# Patient Record
Sex: Male | Born: 1980 | Race: Black or African American | Hispanic: No | Marital: Single | Smoking: Current every day smoker
Health system: Southern US, Community
[De-identification: ages and names within clinical notes are randomized; demographics above are authoritative.]

---

## 2020-10-30 ENCOUNTER — Encounter (HOSPITAL_BASED_OUTPATIENT_CLINIC_OR_DEPARTMENT_OTHER): Payer: Self-pay

## 2020-10-30 ENCOUNTER — Emergency Department (HOSPITAL_BASED_OUTPATIENT_CLINIC_OR_DEPARTMENT_OTHER)
Admission: EM | Admit: 2020-10-30 | Discharge: 2020-10-30 | Disposition: A | Discharge status: Home / Self Care | Payer: 59 | Attending: Emergency Medicine | Admitting: Emergency Medicine

## 2020-10-30 ENCOUNTER — Other Ambulatory Visit: Payer: Self-pay

## 2020-10-30 DIAGNOSIS — F17210 Nicotine dependence, cigarettes, uncomplicated: Secondary | ICD-10-CM | POA: Insufficient documentation

## 2020-10-30 DIAGNOSIS — R059 Cough, unspecified: Secondary | ICD-10-CM | POA: Diagnosis present

## 2020-10-30 DIAGNOSIS — J069 Acute upper respiratory infection, unspecified: Secondary | ICD-10-CM | POA: Insufficient documentation

## 2020-10-30 MED ORDER — BENZONATATE 100 MG PO CAPS: 100.0000 mg | ORAL_CAPSULE | Freq: Three times a day (TID) | ORAL | 0 refills | Status: DC

## 2020-10-30 NOTE — ED Notes (Signed)
Attempted to obtain COVID swab, unable to tolerate, refused addition attempt

## 2020-10-30 NOTE — ED Provider Notes (Signed)
[facility] EMERGENCY DEPARTMENT Provider Note   CSN: [id number] Arrival date & time: 10/30/20  1131     History Chief Complaint  Patient presents with   Cough    Steven Herring is a 40 y.o. male.  Patient presents to the emergency department for evaluation of runny nose and cough.  Symptoms started about 4 days ago.  Today is day 4 of illness.  No reported fevers or chills, no sore throat.  No known sick contacts.  No vomiting or diarrhea.  Denies shortness of breath.  He has used some over-the-counter medication.      History reviewed. No pertinent past medical history.  There are no problems to display for this patient.   History reviewed. No pertinent surgical history.     No family history on file.  Social History   Tobacco Use   Smoking status: Every Day    Packs/day: 1.00    Types: Cigarettes   Smokeless tobacco: Never  Substance Use Topics   Alcohol use: Not Currently   Drug use: Not Currently    Home Medications Prior to Admission medications   Medication Sig Start Date End Date Taking? Authorizing Provider  benzonatate (TESSALON) 100 MG capsule Take 1 capsule (100 mg total) by mouth every 8 (eight) hours. 10/30/20  Yes Renne Crigler, PA-C    Allergies    Patient has no known allergies.  Review of Systems   Review of Systems  Constitutional:  Negative for chills, fatigue and fever.  HENT:  Positive for congestion and rhinorrhea. Negative for ear pain, sinus pressure and sore throat.   Eyes:  Negative for redness.  Respiratory:  Positive for cough. Negative for choking, shortness of breath and wheezing.   Gastrointestinal:  Negative for abdominal pain, diarrhea, nausea and vomiting.  Genitourinary:  Negative for dysuria.  Musculoskeletal:  Negative for myalgias and neck stiffness.  Skin:  Negative for rash.  Neurological:  Negative for headaches.  Hematological:  Negative for adenopathy.   Physical Exam Updated Vital Signs BP  117/81 (BP Location: Left Arm)   Pulse (!) 55   Temp 98.5 F (36.9 C) (Oral)   Resp 20   Ht 5\' 7"  (1.702 m)   Wt 97.5 kg   SpO2 98%   BMI 33.67 kg/m   Physical Exam Vitals and nursing note reviewed.  Constitutional:      General: He is not in acute distress.    Appearance: He is well-developed.  HENT:     Head: Normocephalic and atraumatic.     Right Ear: Tympanic membrane, ear canal and external ear normal.     Left Ear: Tympanic membrane, ear canal and external ear normal.     Nose: Congestion present.     Mouth/Throat:     Pharynx: Oropharynx is clear. No oropharyngeal exudate or posterior oropharyngeal erythema.  Eyes:     General:        Right eye: No discharge.        Left eye: No discharge.     Conjunctiva/sclera: Conjunctivae normal.  Cardiovascular:     Rate and Rhythm: Normal rate and regular rhythm.     Heart sounds: Normal heart sounds.  Pulmonary:     Effort: Pulmonary effort is normal.     Breath sounds: Normal breath sounds.     Comments: Lungs clear to auscultation bilaterally. Abdominal:     Palpations: Abdomen is soft.     Tenderness: There is no abdominal tenderness.  Musculoskeletal:  Cervical back: Normal range of motion and neck supple.  Skin:    General: Skin is warm and dry.  Neurological:     Mental Status: He is alert.    ED Results / Procedures / Treatments   Labs (all labs ordered are listed, but only abnormal results are displayed) Labs Reviewed - No data to display  EKG None  Radiology No results found.  Procedures Procedures   Medications Ordered in ED Medications - No data to display  ED Course  I have reviewed the triage vital signs and the nursing notes.  Pertinent labs & imaging results that were available during my care of the patient were reviewed by me and considered in my medical decision making (see chart for details).  Patient seen and examined.   Vital signs reviewed and are as follows: BP 117/81 (BP  Location: Left Arm)   Pulse (!) 55   Temp 98.5 F (36.9 C) (Oral)   Resp 20   Ht 5\' 7"  (1.702 m)   Wt 97.5 kg   SpO2 98%   BMI 33.67 kg/m   Offered additional times at COVID testing as he could not tolerate first attempt.  Patient declines.  Discussed that he should probably isolate himself through tomorrow assuming it is positive.  Then he can break isolation if he is feeling better.  Will discharge with prescription for Tessalon for symptom control.  Patient counseled on supportive care for viral URI and s/s to return including worsening symptoms, persistent fever, persistent vomiting, or if they have any other concerns. Urged to see PCP if symptoms persist for more than 3 days. Patient verbalizes understanding and agrees with plan.   Steven Herring was evaluated in Emergency Department on 10/30/2020 for the symptoms described in the history of present illness. He was evaluated in the context of the global COVID-19 pandemic, which necessitated consideration that the patient might be at risk for infection with the SARS-CoV-2 virus that causes COVID-19. Institutional protocols and algorithms that pertain to the evaluation of patients at risk for COVID-19 are in a state of rapid change based on information released by regulatory bodies including the CDC and federal and state organizations. These policies and algorithms were followed during the patient's care in the ED.     MDM Rules/Calculators/A&P                           Patient with cough, other upper respiratory symptoms.  Overall symptoms are minor.  No respiratory distress or trouble breathing.  COVID testing attempted, patient cannot tolerate.  Will have him isolate for total 5 days and continue symptomatic treatments.  He looks well.    Final Clinical Impression(s) / ED Diagnoses Final diagnoses:  Viral URI with cough    Rx / DC Orders ED Discharge Orders          Ordered    benzonatate (TESSALON) 100 MG capsule  Every  8 hours        10/30/20 1344             11/01/20, PA-C 10/30/20 1348    11/01/20, MD 10/30/20 [phone]

## 2020-10-30 NOTE — Discharge Instructions (Signed)
Please read and follow all provided instructions.  Your diagnoses today include:  1. Viral URI with cough     You appear to have an upper respiratory infection (URI). An upper respiratory tract infection, or cold, is a viral infection of the air passages leading to the lungs. It should improve gradually after 5-7 days. You may have a lingering cough that lasts for 2- 4 weeks after the infection.  Tests performed today include: Vital signs. See below for your results today.   Medications prescribed:  Tessalon Perles - cough suppressant medication  Take any prescribed medications only as directed. Treatment for your infection is aimed at treating the symptoms. There are no medications, such as antibiotics, that will cure your infection.   Home care instructions:  Follow any educational materials contained in this packet.   Your illness is contagious and can be spread to others, especially during the first 3 or 4 days. It cannot be cured by antibiotics or other medicines. Take basic precautions such as washing your hands often, covering your mouth when you cough or sneeze, and avoiding public places where you could spread your illness to others.   Please continue drinking plenty of fluids.  Use over-the-counter medicines as needed as directed on packaging for symptom relief.  You may also use ibuprofen or tylenol as directed on packaging for pain or fever.  Do not take multiple medicines containing Tylenol or acetaminophen to avoid taking too much of this medication.  Follow-up instructions: Please follow-up with your primary care provider in the next 3 days for further evaluation of your symptoms if you are not feeling better.   Return instructions:  Please return to the Emergency Department if you experience worsening symptoms.  RETURN IMMEDIATELY IF you develop shortness of breath, confusion or altered mental status, a new rash, become dizzy, faint, or poorly responsive, or are unable to  be cared for at home. Please return if you have persistent vomiting and cannot keep down fluids or develop a fever that is not controlled by tylenol or motrin.   Please return if you have any other emergent concerns.  Additional Information:  Your vital signs today were: BP 117/81 (BP Location: Left Arm)   Pulse (!) 55   Temp 98.5 F (36.9 C) (Oral)   Resp 20   Ht 5\' 7"  (1.702 m)   Wt 97.5 kg   SpO2 98%   BMI 33.67 kg/m  If your blood pressure (BP) was elevated above 135/85 this visit, please have this repeated by your doctor within one month. --------------

## 2020-10-30 NOTE — ED Triage Notes (Signed)
Pt reports runny nose and dry cough for a week. Denies being around anyone sick with covid but "wants to be checked for work". Denies fever, N/V or sore throat. NAD in triage, HR 62 at this time

## 2020-11-01 ENCOUNTER — Other Ambulatory Visit: Payer: Self-pay

## 2020-11-01 ENCOUNTER — Emergency Department (HOSPITAL_BASED_OUTPATIENT_CLINIC_OR_DEPARTMENT_OTHER)
Admission: EM | Admit: 2020-11-01 | Discharge: 2020-11-01 | Disposition: A | Discharge status: Home / Self Care | Payer: 59 | Attending: Emergency Medicine | Admitting: Emergency Medicine

## 2020-11-01 ENCOUNTER — Encounter (HOSPITAL_BASED_OUTPATIENT_CLINIC_OR_DEPARTMENT_OTHER): Payer: Self-pay | Admitting: *Deleted

## 2020-11-01 ENCOUNTER — Emergency Department (HOSPITAL_BASED_OUTPATIENT_CLINIC_OR_DEPARTMENT_OTHER): Payer: 59

## 2020-11-01 DIAGNOSIS — F17210 Nicotine dependence, cigarettes, uncomplicated: Secondary | ICD-10-CM | POA: Diagnosis not present

## 2020-11-01 DIAGNOSIS — S51811A Laceration without foreign body of right forearm, initial encounter: Secondary | ICD-10-CM

## 2020-11-01 DIAGNOSIS — W25XXXA Contact with sharp glass, initial encounter: Secondary | ICD-10-CM | POA: Diagnosis not present

## 2020-11-01 DIAGNOSIS — S59911A Unspecified injury of right forearm, initial encounter: Secondary | ICD-10-CM | POA: Diagnosis present

## 2020-11-01 MED ORDER — LIDOCAINE-EPINEPHRINE (PF) 2 %-1:200000 IJ SOLN
10.0000 mL | Freq: Once | INTRAMUSCULAR | Status: AC
  Administered 2020-11-01: 10 mL
  Filled 2020-11-01: qty 20

## 2020-11-01 NOTE — ED Provider Notes (Signed)
[facility] EMERGENCY DEPARTMENT Provider Note   CSN: [id number] Arrival date & time: 11/01/20  1828     History Chief Complaint  Patient presents with   Laceration    Steven Herring is a 40 y.o. male to the ED today after sustaining a laceration to his right forearm.  He states a window pain from a glass fell on his arm cutting him today.  He is up-to-date on tetanus.  He complains of mild pain to the area.  Bleeding  somewhat actively on exam today.  He is not anticoagulated.  No other complaints at this time.  The history is provided by the patient and medical records.      History reviewed. No pertinent past medical history.  There are no problems to display for this patient.   History reviewed. No pertinent surgical history.     No family history on file.  Social History   Tobacco Use   Smoking status: Every Day    Packs/day: 1.00    Types: Cigarettes   Smokeless tobacco: Never  Substance Use Topics   Alcohol use: Not Currently   Drug use: Not Currently    Home Medications Prior to Admission medications   Medication Sig Start Date End Date Taking? Authorizing Provider  benzonatate (TESSALON) 100 MG capsule Take 1 capsule (100 mg total) by mouth every 8 (eight) hours. 10/30/20   Renne Crigler, PA-C    Allergies    Patient has no known allergies.  Review of Systems   Review of Systems  Constitutional:  Negative for fever.  Musculoskeletal:  Positive for arthralgias.  Skin:  Positive for wound.  All other systems reviewed and are negative.  Physical Exam Updated Vital Signs BP 118/64   Pulse 69   Temp 98.6 F (37 C) (Oral)   Resp 16   Ht 5\' 7"  (1.702 m)   Wt 97.5 kg   SpO2 98%   BMI 33.67 kg/m   Physical Exam Vitals and nursing note reviewed.  Constitutional:      Appearance: He is not ill-appearing.  HENT:     Head: Normocephalic and atraumatic.  Eyes:     Conjunctiva/sclera: Conjunctivae normal.  Cardiovascular:     Rate  and Rhythm: Normal rate and regular rhythm.  Pulmonary:     Effort: Pulmonary effort is normal.     Breath sounds: Normal breath sounds.  Musculoskeletal:     Comments: 1.5 cm laceration to dorsal aspect of right mid forearm with mild amount of active bleeding on exam, controlled with pressure. ROM intact to wrist, fingers, and elbow. 2+ radial pulse.   Skin:    General: Skin is warm and dry.     Coloration: Skin is not jaundiced.  Neurological:     Mental Status: He is alert.    ED Results / Procedures / Treatments   Labs (all labs ordered are listed, but only abnormal results are displayed) Labs Reviewed - No data to display  EKG None  Radiology DG Forearm Right  Result Date: 11/01/2020 CLINICAL DATA:  Right forearm laceration from glass. Rule out foreign body. EXAM: RIGHT FOREARM - 2 VIEW COMPARISON:  None. FINDINGS: Normal alignment. No fracture or dislocation. Surgical dressing applied to the radial soft tissues at the level of the distal diaphysis of the radius. No retained radiopaque foreign body identified. IMPRESSION: No retained radiopaque foreign body identified. Electronically Signed   By: 01/01/2021 MD   On: 11/01/2020 20:27    Procedures .10/01/2022Laceration  Repair  Date/Time: 11/01/2020 8:54 PM Performed by: Tanda Rockers, PA-C Authorized by: Tanda Rockers, PA-C   Consent:    Consent obtained:  Verbal   Consent given by:  Patient   Risks discussed:  Infection, pain and poor cosmetic result Universal protocol:    Patient identity confirmed:  Verbally with patient Laceration details:    Location:  Shoulder/arm   Shoulder/arm location:  R lower arm   Length (cm):  1.5   Depth (mm):  2 Pre-procedure details:    Preparation:  Patient was prepped and draped in usual sterile fashion Treatment:    Area cleansed with:  Povidone-iodine   Irrigation solution:  Sterile saline Skin repair:    Repair method:  Sutures   Suture size:  4-0   Suture material:  Nylon    Suture technique:  Simple interrupted   Number of sutures:  2 Approximation:    Approximation:  Close Repair type:    Repair type:  Intermediate Post-procedure details:    Dressing:  Non-adherent dressing   Procedure completion:  Tolerated well, no immediate complications   Medications Ordered in ED Medications  lidocaine-EPINEPHrine (XYLOCAINE W/EPI) 2 %-1:200000 (PF) injection 10 mL (has no administration in time range)    ED Course  I have reviewed the triage vital signs and the nursing notes.  Pertinent labs & imaging results that were available during my care of the patient were reviewed by me and considered in my medical decision making (see chart for details).    MDM Rules/Calculators/A&P                           40 year old male who presents to the ED after sustaining a laceration to his right forearm.  X-ray negative for any foreign body.  This sutures placed.  Patient instructed to return to the ED in 1 week for suture removal.  Instructed to return sooner for signs of infection.  Stable for discharge.  Final Clinical Impression(s) / ED Diagnoses Final diagnoses:  Laceration of right forearm, initial encounter    Rx / DC Orders ED Discharge Orders     None        Discharge Instructions      Please return to the ED in 1 week for suture removal Keep wound clean and dry.  If you begin having some pain from the glass falling onto your arm you can take Ibuprofen/Tylenol for pain, ice it, and elevate it.  Follow up with your PCP regarding ED visit today  Return sooner for any signs of infection including redness/swelling around the wound, drainage of pus, fevers > 100.4       Tanda Rockers, PA-C 11/01/20 2058    Vanetta Mulders, MD 11/04/20 1259

## 2020-11-01 NOTE — Discharge Instructions (Addendum)
Please return to the ED in 1 week for suture removal Keep wound clean and dry.  If you begin having some pain from the glass falling onto your arm you can take Ibuprofen/Tylenol for pain, ice it, and elevate it.  Follow up with your PCP regarding ED visit today  Return sooner for any signs of infection including redness/swelling around the wound, drainage of pus, fevers > 100.4

## 2020-11-01 NOTE — ED Triage Notes (Addendum)
C/o lac to right arm by dirty glass window x 30 mins ago , large bleeding  lac to right forearm , pressure drg applied

## 2020-11-12 ENCOUNTER — Emergency Department (HOSPITAL_BASED_OUTPATIENT_CLINIC_OR_DEPARTMENT_OTHER)
Admission: EM | Admit: 2020-11-12 | Discharge: 2020-11-12 | Disposition: A | Discharge status: Home / Self Care | Payer: Self-pay | Attending: Emergency Medicine | Admitting: Emergency Medicine

## 2020-11-12 ENCOUNTER — Encounter (HOSPITAL_BASED_OUTPATIENT_CLINIC_OR_DEPARTMENT_OTHER): Payer: Self-pay | Admitting: Urology

## 2020-11-12 ENCOUNTER — Other Ambulatory Visit: Payer: Self-pay

## 2020-11-12 DIAGNOSIS — Z4802 Encounter for removal of sutures: Secondary | ICD-10-CM | POA: Insufficient documentation

## 2020-11-12 DIAGNOSIS — W25XXXD Contact with sharp glass, subsequent encounter: Secondary | ICD-10-CM | POA: Insufficient documentation

## 2020-11-12 DIAGNOSIS — F17210 Nicotine dependence, cigarettes, uncomplicated: Secondary | ICD-10-CM | POA: Insufficient documentation

## 2020-11-12 DIAGNOSIS — S51811D Laceration without foreign body of right forearm, subsequent encounter: Secondary | ICD-10-CM | POA: Insufficient documentation

## 2020-11-12 NOTE — ED Notes (Signed)
ED Provider at bedside. 

## 2020-11-12 NOTE — ED Provider Notes (Addendum)
[facility] EMERGENCY DEPARTMENT Provider Note   CSN: [id number] Arrival date & time: 11/12/20  1916     History Chief Complaint  Patient presents with   Suture / Staple Removal    Steven Herring is a 40 y.o. male with no reported medical history.  Patient presents with a chief complaint of suture removal.  Patient sustained laceration to right forearm on 8/1 when a piece of glass accidentally cut him.  Patient was seen in the emergency department and had 2 sutures placed.  Patient denies fevers, chills, numbness, weakness, myalgia, rash, color change, purulent discharge.  Patient is right-hand dominant.   Suture / Staple Removal      History reviewed. No pertinent past medical history.  There are no problems to display for this patient.   History reviewed. No pertinent surgical history.     History reviewed. No pertinent family history.  Social History   Tobacco Use   Smoking status: Every Day    Packs/day: 1.00    Types: Cigarettes   Smokeless tobacco: Never  Substance Use Topics   Alcohol use: Not Currently   Drug use: Not Currently    Home Medications Prior to Admission medications   Medication Sig Start Date End Date Taking? Authorizing Provider  benzonatate (TESSALON) 100 MG capsule Take 1 capsule (100 mg total) by mouth every 8 (eight) hours. 10/30/20   Renne Crigler, PA-C    Allergies    Patient has no known allergies.  Review of Systems   Review of Systems  Constitutional:  Negative for chills and fever.  Gastrointestinal:  Negative for nausea and vomiting.  Musculoskeletal:  Negative for myalgias.  Skin:  Positive for wound. Negative for color change, pallor and rash.  Neurological:  Negative for weakness and numbness.  Psychiatric/Behavioral:  Negative for confusion.    Physical Exam Updated Vital Signs BP 109/74 (BP Location: Left Arm)   Pulse 81   Temp 98.3 F (36.8 C) (Oral)   Resp 18   Ht 5\' 7"  (1.702 m)   Wt 97.5 kg    SpO2 94%   BMI 33.67 kg/m   Physical Exam Vitals and nursing note reviewed.  Constitutional:      General: He is not in acute distress.    Appearance: He is not ill-appearing, toxic-appearing or diaphoretic.  HENT:     Head: Normocephalic.  Eyes:     General: No scleral icterus.       Right eye: No discharge.        Left eye: No discharge.  Cardiovascular:     Rate and Rhythm: Normal rate.  Pulmonary:     Effort: Pulmonary effort is normal.  Musculoskeletal:     Comments: Well-healed wound to posterior and active right forearm with 2 sutures present.  Wound is clean, dry, and intact.  No swelling, erythema, rash, or purulent discharge noted.  No tenderness to palpation.  Skin:    General: Skin is warm and dry.  Neurological:     General: No focal deficit present.     Mental Status: He is alert.     GCS: GCS eye subscore is 4. GCS verbal subscore is 5. GCS motor subscore is 6.  Psychiatric:        Behavior: Behavior is cooperative.    ED Results / Procedures / Treatments   Labs (all labs ordered are listed, but only abnormal results are displayed) Labs Reviewed - No data to display  EKG None  Radiology No results  found.  Procedures .Suture Removal  Date/Time: 11/13/2020 2:16 AM Performed by: Haskel Schroeder, PA-C Authorized by: Haskel Schroeder, PA-C   Consent:    Consent obtained:  Verbal   Consent given by:  Patient   Risks discussed:  Bleeding, pain and wound separation   Alternatives discussed:  No treatment Universal protocol:    Procedure explained and questions answered to patient or proxy's satisfaction: yes     Patient identity confirmed:  Verbally with patient Location:    Location:  Upper extremity   Upper extremity location:  Arm   Arm location:  R lower arm Procedure details:    Wound appearance:  No signs of infection, good wound healing and clean   Number of sutures removed:  2 Post-procedure details:    Post-removal:  No dressing  applied   Procedure completion:  Tolerated well, no immediate complications   Medications Ordered in ED Medications - No data to display  ED Course  I have reviewed the triage vital signs and the nursing notes.  Pertinent labs & imaging results that were available during my care of the patient were reviewed by me and considered in my medical decision making (see chart for details).    MDM Rules/Calculators/A&P                           Alert 40 year old male in no acute distress, nontoxic appearing.  Presents with chief complaint of suture removal.  Per chart review to sutures were placed on 8/1.    Well-healed wound to posterior and active right forearm with 2 sutures present.  Wound is clean, dry, and intact.  No swelling, erythema, rash, or purulent discharge noted.  No tenderness to palpation.   No signs of infection.  Suture removal procedure as noted above.  Discussed results, findings, treatment and follow up. Patient advised of return precautions. Patient verbalized understanding and agreed with plan.    Final Clinical Impression(s) / ED Diagnoses Final diagnoses:  Visit for suture removal    Rx / DC Orders ED Discharge Orders     None        Haskel Schroeder, PA-C 11/13/20 0219    Haskel Schroeder, PA-C 11/22/20 3536    Arby Barrette, MD 12/05/20 1427

## 2020-11-12 NOTE — Discharge Instructions (Addendum)
Your stitches were removed today without an problems.    Get help right away if: You have a fever. You have redness that is spreading from your wound.

## 2020-11-12 NOTE — ED Triage Notes (Signed)
Here for suture removal from right FA,

## 2020-12-21 ENCOUNTER — Other Ambulatory Visit: Payer: Self-pay

## 2020-12-21 ENCOUNTER — Encounter (HOSPITAL_BASED_OUTPATIENT_CLINIC_OR_DEPARTMENT_OTHER): Payer: Self-pay

## 2020-12-21 DIAGNOSIS — S99922A Unspecified injury of left foot, initial encounter: Secondary | ICD-10-CM | POA: Diagnosis present

## 2020-12-21 DIAGNOSIS — S9032XA Contusion of left foot, initial encounter: Secondary | ICD-10-CM | POA: Insufficient documentation

## 2020-12-21 DIAGNOSIS — Y990 Civilian activity done for income or pay: Secondary | ICD-10-CM | POA: Diagnosis not present

## 2020-12-21 DIAGNOSIS — M2012 Hallux valgus (acquired), left foot: Secondary | ICD-10-CM | POA: Insufficient documentation

## 2020-12-21 DIAGNOSIS — F17210 Nicotine dependence, cigarettes, uncomplicated: Secondary | ICD-10-CM | POA: Insufficient documentation
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
## 2020-12-22 ENCOUNTER — Emergency Department (HOSPITAL_BASED_OUTPATIENT_CLINIC_OR_DEPARTMENT_OTHER)
Admission: EM | Admit: 2020-12-22 | Discharge: 2020-12-22 | Disposition: A | Discharge status: Home / Self Care | Payer: No Typology Code available for payment source | Attending: Emergency Medicine | Admitting: Emergency Medicine

## 2020-12-22 ENCOUNTER — Emergency Department (HOSPITAL_BASED_OUTPATIENT_CLINIC_OR_DEPARTMENT_OTHER): Payer: Self-pay | Attending: Emergency Medicine

## 2020-12-22 DIAGNOSIS — S9032XA Contusion of left foot, initial encounter: Secondary | ICD-10-CM

## 2020-12-22 MED ORDER — DICLOFENAC SODIUM 1 % EX GEL: 2.0000 g | Freq: Four times a day (QID) | CUTANEOUS | 0 refills | Status: DC | PRN
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
## 2020-12-22 NOTE — Discharge Instructions (Signed)
You were seen in the emerge department with foot pain after injury.  The x-ray did not show any broken bones.  Please keep the foot elevated and you may use crutches as needed for comfort.  I am taking out of work for the next week and will have you follow closely with a sports medicine doctor listed.  Please call to make the appointment.  He develop any pain into the calf or leg swelling this could be sign of a blood clot but have very low suspicion for this at this point.

## 2020-12-29 ENCOUNTER — Encounter: Payer: Self-pay | Admitting: Family Medicine

## 2020-12-29 ENCOUNTER — Ambulatory Visit (INDEPENDENT_AMBULATORY_CARE_PROVIDER_SITE_OTHER): Payer: No Typology Code available for payment source | Admitting: Family Medicine

## 2020-12-29 ENCOUNTER — Ambulatory Visit: Payer: Self-pay

## 2020-12-29 VITALS — Ht 68.0 in | Wt 215.0 lb

## 2020-12-29 DIAGNOSIS — T148XXA Other injury of unspecified body region, initial encounter: Secondary | ICD-10-CM | POA: Diagnosis not present

## 2020-12-29 DIAGNOSIS — M79672 Pain in left foot: Secondary | ICD-10-CM

## 2020-12-29 NOTE — Patient Instructions (Signed)
Nice to meet you Please try to wean out of the boot  Please try ice   Please send me a message in MyChart with any questions or updates.  Please see me back in 2 weeks.   --Dr. Jordan Likes
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
## 2020-12-29 NOTE — Assessment & Plan Note (Signed)
Initial injury on 9/21.  Occurred at work.  No acute changes appreciated on ultrasound today on x-ray. -Counseled on home exercise therapy and supportive care. -Counseled on CAM Walker. -Provided work note. -Could consider further imaging.

## 2020-12-31 ENCOUNTER — Ambulatory Visit: Payer: Self-pay | Admitting: Family Medicine

## 2021-01-03 ENCOUNTER — Encounter: Payer: Self-pay | Admitting: Orthopedic Surgery

## 2021-01-03 ENCOUNTER — Ambulatory Visit (INDEPENDENT_AMBULATORY_CARE_PROVIDER_SITE_OTHER): Payer: No Typology Code available for payment source | Admitting: Orthopedic Surgery

## 2021-01-03 ENCOUNTER — Other Ambulatory Visit: Payer: Self-pay

## 2021-01-03 VITALS — Ht 68.0 in | Wt 215.0 lb

## 2021-01-03 DIAGNOSIS — S93622A Sprain of tarsometatarsal ligament of left foot, initial encounter: Secondary | ICD-10-CM | POA: Diagnosis not present
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
## 2021-01-04 ENCOUNTER — Other Ambulatory Visit: Payer: Self-pay

## 2021-01-04 DIAGNOSIS — S93622A Sprain of tarsometatarsal ligament of left foot, initial encounter: Secondary | ICD-10-CM

## 2021-01-14 ENCOUNTER — Ambulatory Visit: Payer: Self-pay | Admitting: Family Medicine

## 2021-01-14 NOTE — Progress Notes (Deleted)
  Steven Herring - 40 y.o. male MRN [id number]  Date of birth: 07-14-1980  SUBJECTIVE:  Including CC & ROS.  No chief complaint on file.   Steven Herring is a 40 y.o. male that is  ***.  ***   Review of Systems See HPI   HISTORY: Past Medical, Surgical, Social, and Family History Reviewed & Updated per EMR.   Pertinent Historical Findings include:  No past medical history on file.  No past surgical history on file.  No family history on file.  Social History   Socioeconomic History  . Marital status: Single    Spouse name: Not on file  . Number of children: Not on file  . Years of education: Not on file  . Highest education level: Not on file  Occupational History  . Not on file  Tobacco Use  . Smoking status: Every Day    Packs/day: 1.00    Types: Cigarettes  . Smokeless tobacco: Never  Substance and Sexual Activity  . Alcohol use: Not Currently  . Drug use: Not Currently  . Sexual activity: Not on file  Other Topics Concern  . Not on file  Social History Narrative  . Not on file   Social Determinants of Health   Financial Resource Strain: Not on file  Food Insecurity: Not on file  Transportation Needs: Not on file  Physical Activity: Not on file  Stress: Not on file  Social Connections: Not on file  Intimate Partner Violence: Not on file     PHYSICAL EXAM:  VS: There were no vitals taken for this visit. Physical Exam Gen: NAD, alert, cooperative with exam, well-appearing MSK:  ***      ASSESSMENT & PLAN:   No problem-specific Assessment & Plan notes found for this encounter.

## 2021-01-20 ENCOUNTER — Encounter: Payer: Self-pay | Admitting: Orthopedic Surgery

## 2021-01-20 ENCOUNTER — Ambulatory Visit (INDEPENDENT_AMBULATORY_CARE_PROVIDER_SITE_OTHER): Payer: No Typology Code available for payment source | Admitting: Orthopedic Surgery

## 2021-01-20 ENCOUNTER — Other Ambulatory Visit: Payer: Self-pay

## 2021-01-20 DIAGNOSIS — S93622A Sprain of tarsometatarsal ligament of left foot, initial encounter: Secondary | ICD-10-CM | POA: Diagnosis not present

## 2021-01-20 NOTE — Progress Notes (Signed)
   Office Visit Note   Patient: Steven Herring           Date of Birth: 1980/10/07           MRN: [id number] Visit Date: 01/20/2021              Requested by: No referring provider defined for this encounter. PCP: Pcp, No  Chief Complaint  Patient presents with   Left Foot - Follow-up    MRI review  [facility] DOI 12/20/20      HPI: Patient is a 40 year old gentleman who presents in follow-up for Lisfranc sprain of the left foot.  Patient states he has had no change in his symptoms still has pain across the midfoot.  Patient states he is still smoking.  Patient is on crutches and a fracture boot.  Assessment & Plan: Visit Diagnoses:  1. Lisfranc's sprain, left, initial encounter     Plan: Recommended proceeding with 4 additional weeks of conservative therapy in the fracture boot minimizing weightbearing and stopping smoking.  At follow-up in 4 weeks if he is still symptomatic this would give most 2 months of conservative therapy if he is not showing any improvement would recommend proceeding with a fusion across the midfoot.  Patient is given a note to be out of work for 4 weeks.  Follow-Up Instructions: No follow-ups on file.   Ortho Exam  Patient is alert, oriented, no adenopathy, well-dressed, normal affect, normal respiratory effort. Examination patient has a palpable dorsalis pedis pulse he has pain to palpation across the Lisfranc complex and distraction across the metatarsals reproduces pain at the Lisfranc complex.  Review of the MRI scan does show edema through the Lisfranc complex consistent with a Lisfranc sprain and consistent with his symptoms.  Imaging: No results found. No images are attached to the encounter.  Labs: No results found for: HGBA1C, ESRSEDRATE, CRP, LABURIC, REPTSTATUS, GRAMSTAIN, CULT, LABORGA   No results found for: ALBUMIN, PREALBUMIN, CBC  No results found for: MG No results found for: VD25OH  No results found for: PREALBUMIN No flowsheet data  found.   There is no height or weight on file to calculate BMI.  Orders:  No orders of the defined types were placed in this encounter.  No orders of the defined types were placed in this encounter.    Procedures: No procedures performed  Clinical Data: No additional findings.  ROS:  All other systems negative, except as noted in the HPI. Review of Systems  Objective: Vital Signs: There were no vitals taken for this visit.  Specialty Comments:  No specialty comments available.  PMFS History: Patient Active Problem List   Diagnosis Date Noted   Contusion of bone 12/29/2020   History reviewed. No pertinent past medical history.  History reviewed. No pertinent family history.  History reviewed. No pertinent surgical history. Social History   Occupational History   Not on file  Tobacco Use   Smoking status: Every Day    Packs/day: 1.00    Types: Cigarettes   Smokeless tobacco: Never  Substance and Sexual Activity   Alcohol use: Not Currently   Drug use: Not Currently   Sexual activity: Not on file

## 2021-02-17 ENCOUNTER — Ambulatory Visit (INDEPENDENT_AMBULATORY_CARE_PROVIDER_SITE_OTHER): Payer: No Typology Code available for payment source | Admitting: Orthopedic Surgery

## 2021-02-17 ENCOUNTER — Other Ambulatory Visit: Payer: Self-pay

## 2021-02-17 DIAGNOSIS — S93622A Sprain of tarsometatarsal ligament of left foot, initial encounter: Secondary | ICD-10-CM | POA: Diagnosis not present

## 2021-02-22 ENCOUNTER — Encounter: Payer: Self-pay | Admitting: Orthopedic Surgery

## 2021-02-22 NOTE — Progress Notes (Signed)
   Office Visit Note   Patient: Steven Herring           Date of Birth: 1981/02/20           MRN: [id number] Visit Date: 02/17/2021              Requested by: No referring provider defined for this encounter. PCP: Pcp, No  Chief Complaint  Patient presents with   Left Foot - Follow-up, Injury      HPI: Patient is a 40 year old gentleman status post Lisfranc injury to the left foot.  Patient states he still has pain when he moves he is currently not working with physical therapy.  He has undergone 2 months of conservative therapy.  The MRI scan was consistent with a Lisfranc sprain.  Patient states he did not use the Voltaren gel but is using ibuprofen.  Assessment & Plan: Visit Diagnoses:  1. Lisfranc's sprain, left, initial encounter     Plan: Patient will continue light duty work for 4 weeks follow-up in 4 weeks.  Discussed that if he is not showing improvement in 4 weeks surgical intervention is an option.  Follow-Up Instructions: Return in about 4 weeks (around 03/17/2021).   Ortho Exam  Patient is alert, oriented, no adenopathy, well-dressed, normal affect, normal respiratory effort. Examination patient has good pulses he has good ankle and subtalar motion he has pain to palpation and with distraction across the Lisfranc complex consistent with persistent sprain of the Lisfranc ligaments.  Imaging: No results found. No images are attached to the encounter.  Labs: No results found for: HGBA1C, ESRSEDRATE, CRP, LABURIC, REPTSTATUS, GRAMSTAIN, CULT, LABORGA   No results found for: ALBUMIN, PREALBUMIN, CBC  No results found for: MG No results found for: VD25OH  No results found for: PREALBUMIN No flowsheet data found.   There is no height or weight on file to calculate BMI.  Orders:  No orders of the defined types were placed in this encounter.  No orders of the defined types were placed in this encounter.    Procedures: No procedures performed  Clinical  Data: No additional findings.  ROS:  All other systems negative, except as noted in the HPI. Review of Systems  Objective: Vital Signs: There were no vitals taken for this visit.  Specialty Comments:  No specialty comments available.  PMFS History: Patient Active Problem List   Diagnosis Date Noted   Contusion of bone 12/29/2020   History reviewed. No pertinent past medical history.  History reviewed. No pertinent family history.  History reviewed. No pertinent surgical history. Social History   Occupational History   Not on file  Tobacco Use   Smoking status: Every Day    Packs/day: 1.00    Types: Cigarettes   Smokeless tobacco: Never  Substance and Sexual Activity   Alcohol use: Not Currently   Drug use: Not Currently   Sexual activity: Not on file

## 2021-03-17 ENCOUNTER — Ambulatory Visit (INDEPENDENT_AMBULATORY_CARE_PROVIDER_SITE_OTHER): Payer: No Typology Code available for payment source | Admitting: Orthopedic Surgery

## 2021-03-17 ENCOUNTER — Telehealth: Payer: Self-pay | Admitting: Orthopedic Surgery

## 2021-03-17 ENCOUNTER — Encounter: Payer: Self-pay | Admitting: Orthopedic Surgery

## 2021-03-17 DIAGNOSIS — S93622A Sprain of tarsometatarsal ligament of left foot, initial encounter: Secondary | ICD-10-CM

## 2021-03-17 MED ORDER — IBUPROFEN 800 MG PO TABS: 800.0000 mg | ORAL_TABLET | Freq: Three times a day (TID) | ORAL | 0 refills | Status: DC | PRN

## 2021-03-17 NOTE — Progress Notes (Signed)
° °  Office Visit Note   Patient: Steven Herring           Date of Birth: 14-Aug-1980           MRN: [id number] Visit Date: 03/17/2021              Requested by: No referring provider defined for this encounter. PCP: Pcp, No  Chief Complaint  Patient presents with   Left Foot - Follow-up    Lisfranc sprain       HPI: Patient is a 40 year old gentleman who is status post a work-related sprain of the Lisfranc complex left foot.  Patient has currently been out of work he states he still has pain with ambulation in the fracture boot and has been nonweightbearing with crutches.  He states he has some cramping at night and stabbing pain.  Assessment & Plan: Visit Diagnoses:  1. Lisfranc's sprain, left, initial encounter     Plan: Patient states he would like to give this another month to see if conservative therapy will work before considering surgery.  Patient is given a note for seated work for 4 weeks a prescription for ibuprofen continue with the fracture boot and will evaluate for surgical intervention at follow-up.  Follow-Up Instructions: Return in about 4 weeks (around 04/14/2021).   Ortho Exam  Patient is alert, oriented, no adenopathy, well-dressed, normal affect, normal respiratory effort. Examination patient has a good dorsalis pedis pulse.  There is minimal swelling he still has pain reproduced with palpation across the Lisfranc complex and with distraction across the Lisfranc complex.  There is no redness no cellulitis no skin breakdown.  Imaging: No results found. No images are attached to the encounter.  Labs: No results found for: HGBA1C, ESRSEDRATE, CRP, LABURIC, REPTSTATUS, GRAMSTAIN, CULT, LABORGA   No results found for: ALBUMIN, PREALBUMIN, CBC  No results found for: MG No results found for: VD25OH  No results found for: PREALBUMIN No flowsheet data found.   There is no height or weight on file to calculate BMI.  Orders:  No orders of the defined types  were placed in this encounter.  Meds ordered this encounter  Medications   ibuprofen (ADVIL) 800 MG tablet    Sig: Take 1 tablet (800 mg total) by mouth every 8 (eight) hours as needed.    Dispense:  30 tablet    Refill:  0     Procedures: No procedures performed  Clinical Data: No additional findings.  ROS:  All other systems negative, except as noted in the HPI. Review of Systems  Objective: Vital Signs: There were no vitals taken for this visit.  Specialty Comments:  No specialty comments available.  PMFS History: Patient Active Problem List   Diagnosis Date Noted   Contusion of bone 12/29/2020   History reviewed. No pertinent past medical history.  History reviewed. No pertinent family history.  History reviewed. No pertinent surgical history. Social History   Occupational History   Not on file  Tobacco Use   Smoking status: Every Day    Packs/day: 1.00    Types: Cigarettes   Smokeless tobacco: Never  Substance and Sexual Activity   Alcohol use: Not Currently   Drug use: Not Currently   Sexual activity: Not on file

## 2021-03-17 NOTE — Telephone Encounter (Signed)
Pt left his cell phone in the room after his appt, tried to call mothers number thats on file and could not get through or leave vm to inform he left his phone at our office

## 2021-04-14 ENCOUNTER — Other Ambulatory Visit: Payer: Self-pay

## 2021-04-14 ENCOUNTER — Encounter: Payer: Self-pay | Admitting: Orthopedic Surgery

## 2021-04-14 ENCOUNTER — Ambulatory Visit (INDEPENDENT_AMBULATORY_CARE_PROVIDER_SITE_OTHER): Payer: No Typology Code available for payment source | Admitting: Orthopedic Surgery

## 2021-04-14 DIAGNOSIS — S93622A Sprain of tarsometatarsal ligament of left foot, initial encounter: Secondary | ICD-10-CM

## 2021-04-14 NOTE — Progress Notes (Signed)
° °  Office Visit Note   Patient: Steven Herring           Date of Birth: 29-Nov-1980           MRN: [id number] Visit Date: 04/14/2021              Requested by: No referring provider defined for this encounter. PCP: Pcp, No  Chief Complaint  Patient presents with   Left Foot - Follow-up    DOI 12/20/20 Lisfranc sprain       HPI: Patient is a 41 year old gentleman who presents in follow-up 4 months status post Lisfranc injury left foot.  Patient has been ambulating in a fracture boot and crutches and states he still has pain with activities of daily living.  Patient has been on light duty seated work.  Assessment & Plan: Visit Diagnoses:  1. Lisfranc's sprain, left, initial encounter     Plan: With the persistent pain and inability to perform activities of daily living we will plan for a Lisfranc fusion at [facility] as an outpatient.  Risks and benefits were discussed including the importance of smoking cessation.  Discussed that with persistent smoking he has an increased risk of the wound not healing and increased risk of infection and a potential for additional surgery.  Follow-Up Instructions: Return in about 2 weeks (around 04/28/2021).   Ortho Exam  Patient is alert, oriented, no adenopathy, well-dressed, normal affect, normal respiratory effort. Examination patient has a palpable pulse he has good ankle and subtalar motion he has pain with attempted distraction across the Lisfranc complex.  Imaging: No results found. No images are attached to the encounter.  Labs: No results found for: HGBA1C, ESRSEDRATE, CRP, LABURIC, REPTSTATUS, GRAMSTAIN, CULT, LABORGA   No results found for: ALBUMIN, PREALBUMIN, CBC  No results found for: MG No results found for: VD25OH  No results found for: PREALBUMIN No flowsheet data found.   There is no height or weight on file to calculate BMI.  Orders:  No orders of the defined types were placed in this encounter.  No orders of  the defined types were placed in this encounter.    Procedures: No procedures performed  Clinical Data: No additional findings.  ROS:  All other systems negative, except as noted in the HPI. Review of Systems  Objective: Vital Signs: There were no vitals taken for this visit.  Specialty Comments:  No specialty comments available.  PMFS History: Patient Active Problem List   Diagnosis Date Noted   Contusion of bone 12/29/2020   History reviewed. No pertinent past medical history.  History reviewed. No pertinent family history.  History reviewed. No pertinent surgical history. Social History   Occupational History   Not on file  Tobacco Use   Smoking status: Every Day    Packs/day: 1.00    Types: Cigarettes   Smokeless tobacco: Never  Substance and Sexual Activity   Alcohol use: Not Currently   Drug use: Not Currently   Sexual activity: Not on file

## 2021-04-28 ENCOUNTER — Telehealth: Payer: Self-pay | Admitting: Orthopedic Surgery

## 2021-04-28 NOTE — Telephone Encounter (Signed)
Steven Herring's midfoot fusion has been approved by WC.  I called and left a voicemail for him to please return  my call to discuss scheduling surgery.

## 2021-05-18 NOTE — Progress Notes (Signed)
Multiple attempts to reach this pt. His mailbox is full, however, pre-op instructions left on the mother's machine to arrive at [facility]" at 0530, surgery time [phone]. Nothing to eat or drink after midnight on 2/16. Take Tramadol only as needed on morning of the surgery.

## 2021-05-19 NOTE — Anesthesia Preprocedure Evaluation (Addendum)
Anesthesia Evaluation  Patient identified by MRN, date of birth, ID band Patient awake    Reviewed: Allergy & Precautions, NPO status , Patient's Chart, lab work & pertinent test results  Airway Mallampati: II  TM Distance: >3 FB Neck ROM: Full    Dental  (+) Dental Advisory Given, Teeth Intact   Pulmonary Current Smoker,    Pulmonary exam normal breath sounds clear to auscultation       Cardiovascular negative cardio ROS Normal cardiovascular exam Rhythm:Regular Rate:Normal     Neuro/Psych negative neurological ROS     GI/Hepatic negative GI ROS, Neg liver ROS,   Endo/Other  negative endocrine ROS  Renal/GU negative Renal ROS     Musculoskeletal negative musculoskeletal ROS (+)   Abdominal   Peds  Hematology negative hematology ROS (+)   Anesthesia Other Findings   Reproductive/Obstetrics                            Anesthesia Physical Anesthesia Plan  ASA: 2  Anesthesia Plan: General   Post-op Pain Management: Tylenol PO (pre-op)*, Celebrex PO (pre-op)* and Ketamine IV*   Induction: Intravenous  PONV Risk Score and Plan: 1 and Treatment may vary due to age or medical condition, Midazolam, Ondansetron and Dexamethasone  Airway Management Planned: LMA  Additional Equipment:   Intra-op Plan:   Post-operative Plan: Extubation in OR  Informed Consent: I have reviewed the patients History and Physical, chart, labs and discussed the procedure including the risks, benefits and alternatives for the proposed anesthesia with the patient or authorized representative who has indicated his/her understanding and acceptance.     Dental advisory given  Plan Discussed with: CRNA  Anesthesia Plan Comments: (Patient refused preoperative regional block. Regional process as well as risks/benefits extensively explained.  Post op block offered if he felt he needed.)      Anesthesia Quick  Evaluation

## 2021-05-20 ENCOUNTER — Encounter (HOSPITAL_COMMUNITY): Payer: Self-pay | Admitting: Orthopedic Surgery

## 2021-05-20 ENCOUNTER — Encounter (HOSPITAL_COMMUNITY)
Admission: RE | Disposition: A | Discharge status: Home / Self Care | Payer: Self-pay | Source: Ambulatory Visit | Attending: Orthopedic Surgery

## 2021-05-20 ENCOUNTER — Ambulatory Visit (HOSPITAL_COMMUNITY)
Admission: RE | Admit: 2021-05-20 | Discharge: 2021-05-20 | Disposition: A | Discharge status: Home / Self Care | Payer: No Typology Code available for payment source | Source: Ambulatory Visit | Attending: Orthopedic Surgery | Admitting: Orthopedic Surgery

## 2021-05-20 ENCOUNTER — Other Ambulatory Visit: Payer: Self-pay

## 2021-05-20 ENCOUNTER — Ambulatory Visit (HOSPITAL_BASED_OUTPATIENT_CLINIC_OR_DEPARTMENT_OTHER): Payer: No Typology Code available for payment source | Admitting: Anesthesiology

## 2021-05-20 ENCOUNTER — Ambulatory Visit (HOSPITAL_COMMUNITY): Payer: No Typology Code available for payment source | Admitting: Anesthesiology

## 2021-05-20 ENCOUNTER — Ambulatory Visit (HOSPITAL_COMMUNITY): Payer: No Typology Code available for payment source

## 2021-05-20 DIAGNOSIS — S93325A Dislocation of tarsometatarsal joint of left foot, initial encounter: Secondary | ICD-10-CM | POA: Diagnosis present

## 2021-05-20 DIAGNOSIS — F17210 Nicotine dependence, cigarettes, uncomplicated: Secondary | ICD-10-CM | POA: Insufficient documentation

## 2021-05-20 DIAGNOSIS — Y939 Activity, unspecified: Secondary | ICD-10-CM | POA: Diagnosis not present

## 2021-05-20 DIAGNOSIS — X58XXXA Exposure to other specified factors, initial encounter: Secondary | ICD-10-CM | POA: Diagnosis not present

## 2021-05-20 HISTORY — PX: FOOT ARTHRODESIS: SHX1655

## 2021-05-20 LAB — CBC
HCT: 47.9 % (ref 39.0–52.0)
Hemoglobin: 15.5 g/dL (ref 13.0–17.0)
MCH: 28.3 pg (ref 26.0–34.0)
MCHC: 32.4 g/dL (ref 30.0–36.0)
MCV: 87.4 fL (ref 80.0–100.0)
Platelets: 263 10*3/uL (ref 150–400)
RBC: 5.48 MIL/uL (ref 4.22–5.81)
RDW: 14.1 % (ref 11.5–15.5)
WBC: 5.5 10*3/uL (ref 4.0–10.5)
nRBC: 0 % (ref 0.0–0.2)

## 2021-05-20 SURGERY — FUSION, JOINT, FOOT
Anesthesia: General | Site: Foot | Laterality: Left

## 2021-05-20 MED ORDER — CELECOXIB 200 MG PO CAPS: 200.0000 mg | ORAL_CAPSULE | Freq: Once | ORAL | Status: AC

## 2021-05-20 MED ORDER — FENTANYL CITRATE (PF) 250 MCG/5ML IJ SOLN
INTRAMUSCULAR | Status: AC
  Filled 2021-05-20: qty 5

## 2021-05-20 MED ORDER — OXYCODONE HCL 5 MG PO TABS
ORAL_TABLET | ORAL | Status: AC
  Administered 2021-05-20: 5 mg via ORAL
  Filled 2021-05-20: qty 1

## 2021-05-20 MED ORDER — ACETAMINOPHEN 500 MG PO TABS: 1000.0000 mg | ORAL_TABLET | Freq: Once | ORAL | Status: AC

## 2021-05-20 MED ORDER — BUPIVACAINE HCL (PF) 0.25 % IJ SOLN
INTRAMUSCULAR | Status: DC | PRN
  Administered 2021-05-20: 10 mL

## 2021-05-20 MED ORDER — CHLORHEXIDINE GLUCONATE 0.12 % MT SOLN
OROMUCOSAL | Status: AC
  Administered 2021-05-20: 15 mL via OROMUCOSAL
  Filled 2021-05-20: qty 15

## 2021-05-20 MED ORDER — PROPOFOL 10 MG/ML IV BOLUS
INTRAVENOUS | Status: DC | PRN
Start: 2021-05-20 — End: 2021-05-20
  Administered 2021-05-20: 100 mg via INTRAVENOUS
  Administered 2021-05-20: 50 mg via INTRAVENOUS

## 2021-05-20 MED ORDER — MIDAZOLAM HCL 2 MG/2ML IJ SOLN
INTRAMUSCULAR | Status: AC
  Filled 2021-05-20: qty 2

## 2021-05-20 MED ORDER — 0.9 % SODIUM CHLORIDE (POUR BTL) OPTIME
TOPICAL | Status: DC | PRN
  Administered 2021-05-20: 1000 mL

## 2021-05-20 MED ORDER — HYDROMORPHONE HCL 1 MG/ML IJ SOLN
INTRAMUSCULAR | Status: AC
  Administered 2021-05-20: 0.5 mg via INTRAVENOUS
  Filled 2021-05-20: qty 2

## 2021-05-20 MED ORDER — DEXAMETHASONE SODIUM PHOSPHATE 4 MG/ML IJ SOLN
INTRAMUSCULAR | Status: DC | PRN
  Administered 2021-05-20: 10 mg via INTRAVENOUS

## 2021-05-20 MED ORDER — ORAL CARE MOUTH RINSE: 15.0000 mL | Freq: Once | OROMUCOSAL | Status: AC

## 2021-05-20 MED ORDER — PROPOFOL 500 MG/50ML IV EMUL
INTRAVENOUS | Status: DC | PRN
  Administered 2021-05-20: 100 ug/kg/min via INTRAVENOUS

## 2021-05-20 MED ORDER — BUPIVACAINE HCL (PF) 0.25 % IJ SOLN
INTRAMUSCULAR | Status: AC
  Filled 2021-05-20: qty 30

## 2021-05-20 MED ORDER — OXYCODONE HCL 5 MG/5ML PO SOLN: 5.0000 mg | Freq: Once | ORAL | Status: AC | PRN

## 2021-05-20 MED ORDER — CEFAZOLIN SODIUM-DEXTROSE 2-4 GM/100ML-% IV SOLN
2.0000 g | INTRAVENOUS | Status: AC
  Administered 2021-05-20: 2 g via INTRAVENOUS
  Filled 2021-05-20: qty 100

## 2021-05-20 MED ORDER — KETAMINE HCL 10 MG/ML IJ SOLN
INTRAMUSCULAR | Status: DC | PRN
  Administered 2021-05-20: 30 mg via INTRAVENOUS

## 2021-05-20 MED ORDER — CELECOXIB 200 MG PO CAPS
ORAL_CAPSULE | ORAL | Status: AC
  Administered 2021-05-20: 200 mg via ORAL
  Filled 2021-05-20: qty 1

## 2021-05-20 MED ORDER — MEPERIDINE HCL 25 MG/ML IJ SOLN: 6.2500 mg | INTRAMUSCULAR | Status: DC | PRN

## 2021-05-20 MED ORDER — PROPOFOL 10 MG/ML IV BOLUS
INTRAVENOUS | Status: AC
  Filled 2021-05-20: qty 20

## 2021-05-20 MED ORDER — OXYCODONE HCL 5 MG PO TABS: 5.0000 mg | ORAL_TABLET | Freq: Once | ORAL | Status: AC | PRN

## 2021-05-20 MED ORDER — ACETAMINOPHEN 500 MG PO TABS
ORAL_TABLET | ORAL | Status: AC
  Administered 2021-05-20: 1000 mg via ORAL
  Filled 2021-05-20: qty 2

## 2021-05-20 MED ORDER — LACTATED RINGERS IV SOLN: INTRAVENOUS | Status: DC

## 2021-05-20 MED ORDER — CHLORHEXIDINE GLUCONATE 0.12 % MT SOLN: 15.0000 mL | Freq: Once | OROMUCOSAL | Status: AC

## 2021-05-20 MED ORDER — HYDROMORPHONE HCL 1 MG/ML IJ SOLN
0.2500 mg | INTRAMUSCULAR | Status: DC | PRN
  Administered 2021-05-20: 0.5 mg via INTRAVENOUS

## 2021-05-20 MED ORDER — KETAMINE HCL 50 MG/5ML IJ SOSY
PREFILLED_SYRINGE | INTRAMUSCULAR | Status: AC
  Filled 2021-05-20: qty 5

## 2021-05-20 MED ORDER — ONDANSETRON HCL 4 MG/2ML IJ SOLN
INTRAMUSCULAR | Status: DC | PRN
  Administered 2021-05-20: 4 mg via INTRAVENOUS

## 2021-05-20 MED ORDER — OXYCODONE-ACETAMINOPHEN 5-325 MG PO TABS
1.0000 | ORAL_TABLET | ORAL | 0 refills | Status: DC | PRN
Start: 2021-05-20 — End: 2021-06-02

## 2021-05-20 MED ORDER — MIDAZOLAM HCL 2 MG/2ML IJ SOLN
INTRAMUSCULAR | Status: DC | PRN
Start: 2021-05-20 — End: 2021-05-20
  Administered 2021-05-20: 2 mg via INTRAVENOUS

## 2021-05-20 MED ORDER — FENTANYL CITRATE (PF) 250 MCG/5ML IJ SOLN
INTRAMUSCULAR | Status: DC | PRN
  Administered 2021-05-20 (×3): 20 ug via INTRAVENOUS
  Administered 2021-05-20 (×2): 50 ug via INTRAVENOUS
  Administered 2021-05-20 (×2): 20 ug via INTRAVENOUS
  Administered 2021-05-20: 50 ug via INTRAVENOUS

## 2021-05-20 MED ORDER — PROMETHAZINE HCL 25 MG/ML IJ SOLN: 6.2500 mg | INTRAMUSCULAR | Status: DC | PRN

## 2021-05-20 SURGICAL SUPPLY — 44 items
BAG COUNTER SPONGE SURGICOUNT (BAG) ×2 IMPLANT
BANDAGE ESMARK 6X9 LF (GAUZE/BANDAGES/DRESSINGS) IMPLANT
BIT DRILL 3.0 6IN (DRILL) IMPLANT
BLADE SAW SGTL HD 18.5X60.5X1. (BLADE) ×2 IMPLANT
BLADE SURG 10 STRL SS (BLADE) IMPLANT
BNDG COHESIVE 4X5 TAN ST LF (GAUZE/BANDAGES/DRESSINGS) ×1 IMPLANT
BNDG COHESIVE 4X5 TAN STRL (GAUZE/BANDAGES/DRESSINGS) ×1 IMPLANT
BNDG ESMARK 6X9 LF (GAUZE/BANDAGES/DRESSINGS)
BNDG GAUZE ELAST 4 BULKY (GAUZE/BANDAGES/DRESSINGS) ×3 IMPLANT
COTTON STERILE ROLL (GAUZE/BANDAGES/DRESSINGS) ×1 IMPLANT
COVER MAYO STAND STRL (DRAPES) IMPLANT
COVER SURGICAL LIGHT HANDLE (MISCELLANEOUS) ×4 IMPLANT
DRAPE INCISE IOBAN 66X45 STRL (DRAPES) ×2 IMPLANT
DRAPE OEC MINIVIEW 54X84 (DRAPES) IMPLANT
DRAPE U-SHAPE 47X51 STRL (DRAPES) ×2 IMPLANT
DRILL 3.0 6IN (DRILL) ×2
DRSG ADAPTIC 3X8 NADH LF (GAUZE/BANDAGES/DRESSINGS) ×2 IMPLANT
DURAPREP 26ML APPLICATOR (WOUND CARE) ×1 IMPLANT
ELECT REM PT RETURN 9FT ADLT (ELECTROSURGICAL) ×2
ELECTRODE REM PT RTRN 9FT ADLT (ELECTROSURGICAL) ×1 IMPLANT
GAUZE SPONGE 4X4 12PLY STRL (GAUZE/BANDAGES/DRESSINGS) ×2 IMPLANT
GLOVE SURG ORTHO LTX SZ9 (GLOVE) ×2 IMPLANT
GLOVE SURG UNDER POLY LF SZ9 (GLOVE) ×2 IMPLANT
GOWN STRL REUS W/ TWL XL LVL3 (GOWN DISPOSABLE) ×3 IMPLANT
GOWN STRL REUS W/TWL XL LVL3 (GOWN DISPOSABLE) ×3
GUIDEWIRE 1.6 6IN (WIRE) ×2 IMPLANT
KIT BASIN OR (CUSTOM PROCEDURE TRAY) ×2 IMPLANT
KIT TURNOVER KIT B (KITS) ×2 IMPLANT
MANIFOLD NEPTUNE II (INSTRUMENTS) ×2 IMPLANT
NS IRRIG 1000ML POUR BTL (IV SOLUTION) ×2 IMPLANT
PACK ORTHO EXTREMITY (CUSTOM PROCEDURE TRAY) ×2 IMPLANT
PAD ARMBOARD 7.5X6 YLW CONV (MISCELLANEOUS) ×4 IMPLANT
PAD CAST 4YDX4 CTTN HI CHSV (CAST SUPPLIES) ×1 IMPLANT
PADDING CAST COTTON 4X4 STRL (CAST SUPPLIES) ×1
SCREW CANN SHT HDLS 4X36 (Screw) ×1 IMPLANT
SCREW CANN SHT HDLS 4X40 (Screw) ×1 IMPLANT
SPONGE T-LAP 18X18 ~~LOC~~+RFID (SPONGE) ×2 IMPLANT
SUCTION FRAZIER HANDLE 10FR (MISCELLANEOUS) ×1
SUCTION TUBE FRAZIER 10FR DISP (MISCELLANEOUS) ×1 IMPLANT
SUT ETHILON 2 0 PSLX (SUTURE) ×5 IMPLANT
TOWEL GREEN STERILE (TOWEL DISPOSABLE) ×2 IMPLANT
TOWEL GREEN STERILE FF (TOWEL DISPOSABLE) ×2 IMPLANT
TUBE CONNECTING 12X1/4 (SUCTIONS) ×2 IMPLANT
WATER STERILE IRR 1000ML POUR (IV SOLUTION) ×2 IMPLANT

## 2021-05-20 NOTE — Op Note (Signed)
05/20/2021  8:24 AM  PATIENT:  Steven Herring    PRE-OPERATIVE DIAGNOSIS:  Left Lisfranc dislocation through the base of the first metatarsal and Lisfranc complex  POST-OPERATIVE DIAGNOSIS:  Same  PROCEDURE:  LEFT MIDFOOT FUSION C-arm fluoroscopy to verify alignment  SURGEON:  Newt Minion, MD  PHYSICIAN ASSISTANT:None ANESTHESIA:   General  PREOPERATIVE INDICATIONS:  Marshel Rolnick is a  41 y.o. male with a diagnosis of Left Lisfranc Sprain who failed conservative measures and elected for surgical management.    The risks benefits and alternatives were discussed with the patient preoperatively including but not limited to the risks of infection, bleeding, nerve injury, cardiopulmonary complications, the need for revision surgery, among others, and the patient was willing to proceed.  OPERATIVE IMPLANTS: 4.0 headless cannulated screws x2.  @ENCIMAGES @  OPERATIVE FINDINGS: C-arm fluoroscopy verified reduction of the Lisfranc complex  OPERATIVE PROCEDURE: Patient brought the operating room and underwent a general anesthetic.  After adequate levels anesthesia were obtained patient's left lower extremity was prepped using DuraPrep draped into a sterile field a timeout was called.  A dorsal incision was made over the first interspace.  Blunt dissection was carried down to the base of the first metatarsal the EHL and anterior tibial tendon were protected.  The base of the first metatarsal medial cuneiform joint was subluxed and noncongruent.  Retractors were placed and a 4.0 bur was used with the rem B to debride the articular cartilage from the base of the first metatarsal medial cuneiform.  The joint was reduced and stabilized with a K wire.  C-arm fluoroscopy verified congruence and this was then stabilized and compressed with a 36 mm headless cannulated screw.  A guidewire was then placed from the medial cuneiform into the base of the second metatarsal see arthroscopy verified alignment and  a 40 mm screw was used to stabilize the Lisfranc ligament.  The wound was irrigated normal saline incision closed using 2-0 nylon a sterile dressing was applied patient was extubated taken the PACU in stable condition.  Patient underwent local infiltration with 10 cc of quarter percent Marcaine plain.   DISCHARGE PLANNING:  Antibiotic duration: Preoperative antibiotics  Weightbearing: Touchdown weightbearing on the left  Pain medication: Prescription for Percocet  Dressing care/ Wound VAC: Dry dressing  Ambulatory devices: Crutches and Cam walker  Discharge to: Home.  Follow-up: In the office 1 week post operative.

## 2021-05-20 NOTE — Anesthesia Postprocedure Evaluation (Signed)
Anesthesia Post Note  Patient: Steven Herring  Procedure(s) Performed: LEFT MIDFOOT FUSION (Left: Foot)     Patient location during evaluation: PACU Anesthesia Type: General Level of consciousness: sedated and patient cooperative Pain management: pain level controlled Vital Signs Assessment: post-procedure vital signs reviewed and stable Respiratory status: spontaneous breathing Cardiovascular status: stable Anesthetic complications: no   No notable events documented.  Last Vitals:  Vitals:   05/20/21 0920 05/20/21 0930  BP: (!) 140/99 (!) 146/96  Pulse: 62 (!) 50  Resp: 14 (!) 21  Temp:  36.6 C  SpO2: 96% 97%    Last Pain:  Vitals:   05/20/21 0930  TempSrc:   PainSc: [place]

## 2021-05-20 NOTE — Anesthesia Procedure Notes (Signed)
Procedure Name: LMA Insertion Date/Time: 05/20/2021 7:44 AM Performed by: Cy Blamer, CRNA Pre-anesthesia Checklist: Patient identified, Emergency Drugs available, Suction available, Patient being monitored and Timeout performed Patient Re-evaluated:Patient Re-evaluated prior to induction Oxygen Delivery Method: Circle system utilized Preoxygenation: Pre-oxygenation with 100% oxygen Induction Type: IV induction Ventilation: Mask ventilation without difficulty LMA: LMA inserted LMA Size: 5.0 Tube secured with: Tape Dental Injury: Teeth and Oropharynx as per pre-operative assessment

## 2021-05-20 NOTE — H&P (Signed)
Steven Herring is an 41 y.o. male.   Chief Complaint: Left midfoot status post Lisfranc injury HPI: Patient is a 41 year old gentleman who presents in follow-up 4 months status post Lisfranc injury left foot.  Patient has been ambulating in a fracture boot and crutches and states he still has pain with activities of daily living.  Patient has been on light duty seated work.  Left foot midfoot pain secondary to Lisfranc injury  History reviewed. No pertinent past medical history.  History reviewed. No pertinent surgical history.  History reviewed. No pertinent family history. Social History:  reports that he has been smoking cigarettes. He has been smoking an average of 1 pack per day. He has never used smokeless tobacco. He reports that he does not currently use alcohol. He reports that he does not currently use drugs.  Allergies: No Known Allergies  Medications Prior to Admission  Medication Sig Dispense Refill   ibuprofen (ADVIL) 800 MG tablet Take 1 tablet (800 mg total) by mouth every 8 (eight) hours as needed. 30 tablet 0   traMADol (ULTRAM) 50 MG tablet Take 50 mg by mouth every 6 (six) hours as needed for moderate pain.     diclofenac Sodium (VOLTAREN) 1 % GEL Apply 2 g topically 4 (four) times daily as needed. (Patient not taking: Reported on 05/19/2021) 100 g 0    Results for orders placed or performed during the hospital encounter of 05/20/21 (from the past 48 hour(s))  CBC per protocol     Status: None   Collection Time: 05/20/21  5:49 AM  Result Value Ref Range   WBC 5.5 4.0 - 10.5 K/uL   RBC 5.48 4.22 - 5.81 MIL/uL   Hemoglobin 15.5 13.0 - 17.0 g/dL   HCT 44.0 10.2 - 72.5 %   MCV 87.4 80.0 - 100.0 fL   MCH 28.3 26.0 - 34.0 pg   MCHC 32.4 30.0 - 36.0 g/dL   RDW 36.6 44.0 - 34.7 %   Platelets 263 150 - 400 K/uL   nRBC 0.0 0.0 - 0.2 %    Comment: Performed at [facility], [address], [place], [place] [address]   No results found.  Review of Systems  All  other systems reviewed and are negative.  Blood pressure (!) 144/86, pulse 61, temperature 97.8 F (36.6 C), temperature source Oral, resp. rate 17, height 5\' 8"  (1.727 m), weight 95.3 kg, SpO2 98 %. Physical Exam  Patient is alert, oriented, no adenopathy, well-dressed, normal affect, normal respiratory effort. Examination patient has a palpable pulse he has good ankle and subtalar motion he has pain with attempted distraction across the Lisfranc complex. Assessment/Plan 1. Lisfranc's sprain, left, initial encounter       Plan: With the persistent pain and inability to perform activities of daily living we will plan for a Lisfranc fusion at [facility] as an outpatient.  Risks and benefits were discussed including the importance of smoking cessation.  Discussed that with persistent smoking he has an increased risk of the wound not healing and increased risk of infection and a potential for additional surgery.  [facility], MD 05/20/2021, 6:40 AM

## 2021-05-20 NOTE — Transfer of Care (Signed)
Immediate Anesthesia Transfer of Care Note  Patient: Steven Herring  Procedure(s) Performed: LEFT MIDFOOT FUSION (Left: Foot)  Patient Location: PACU  Anesthesia Type:General  Level of Consciousness: awake, alert  and oriented  Airway & Oxygen Therapy: Patient connected to face mask oxygen  Post-op Assessment: Report given to RN, Post -op Vital signs reviewed and stable, Patient moving all extremities X 4 and Patient able to stick tongue midline  Post vital signs: Reviewed  Last Vitals:  Vitals Value Taken Time  BP 116/61   Temp 97.0   Pulse 71   Resp 16   SpO2 99     Last Pain:  Vitals:   05/20/21 0614  TempSrc:   PainSc: 7       Patients Stated Pain Goal: 3 (05/20/21 0277)  Complications: No notable events documented.

## 2021-05-23 ENCOUNTER — Encounter (HOSPITAL_COMMUNITY): Payer: Self-pay | Admitting: Orthopedic Surgery

## 2021-05-24 ENCOUNTER — Telehealth: Payer: Self-pay | Admitting: Radiology

## 2021-05-24 NOTE — Telephone Encounter (Signed)
FYI   Patient called triage line wanting to know how long he needed to keep the dressing on. He is status post left midfoot fusion on 05/20/2021. I advised that he would leave the original dressing on until his scheduled follow up in the office. He expressed that the bandage is tight and we discussed elevating higher than his heart and that the dressing will fill tight with increased swelling if he does not elevate high enough. He expressed understanding.  The dressing feels good now and he denies seeing any new blood, drainage.  He will call if he has any other questions/problems.

## 2021-06-02 ENCOUNTER — Ambulatory Visit (INDEPENDENT_AMBULATORY_CARE_PROVIDER_SITE_OTHER): Payer: No Typology Code available for payment source | Admitting: Orthopedic Surgery

## 2021-06-02 ENCOUNTER — Ambulatory Visit (INDEPENDENT_AMBULATORY_CARE_PROVIDER_SITE_OTHER): Payer: No Typology Code available for payment source

## 2021-06-02 DIAGNOSIS — S93622A Sprain of tarsometatarsal ligament of left foot, initial encounter: Secondary | ICD-10-CM | POA: Diagnosis not present

## 2021-06-02 MED ORDER — OXYCODONE-ACETAMINOPHEN 5-325 MG PO TABS: 1.0000 | ORAL_TABLET | ORAL | 0 refills | Status: DC | PRN

## 2021-06-07 ENCOUNTER — Encounter: Payer: Self-pay | Admitting: Orthopedic Surgery

## 2021-06-07 NOTE — Progress Notes (Signed)
? ?Office Visit Note ?  ?Patient: Steven Herring           ?Date of Birth: Aug 05, 1980           ?MRN: [id number] ?Visit Date: 06/02/2021 ?             ?Requested by: No referring provider defined for this encounter. ?PCP: Pcp, No ? ?Chief Complaint  ?Patient presents with  ? Left Foot - Routine Post Op  ?  05/20/21 left midfoot fusion for lisfranc fracture  ? ? ? ? ?HPI: ?Patient is a 41 year old gentleman who presents 2 weeks status post left midfoot fusion for a Lisfranc injury.  Date of injury 12/20/2020. ? ?Assessment & Plan: ?Visit Diagnoses:  ?1. Lisfranc's sprain, left, initial encounter   ? ? ?Plan: Recommended elevation and range of motion of the ankle including the importance of dorsiflexion of the ankle.  Continue nonweightbearing.  Sutures harvested today.  Continue out of work for 4 weeks. ? ?Three-view radiographs of the left foot at follow-up at which time we will initiate weightbearing. ? ?Follow-Up Instructions: Return in about 3 weeks (around 06/23/2021).  ? ?Ortho Exam ? ?Patient is alert, oriented, no adenopathy, well-dressed, normal affect, normal respiratory effort. ?Examination incision is well-healed there is increased swelling discussed the importance of elevation and range of motion patient has dorsiflexion short of neutral. ? ?Imaging: ?No results found. ?No images are attached to the encounter. ? ?Labs: ?No results found for: HGBA1C, ESRSEDRATE, CRP, LABURIC, REPTSTATUS, GRAMSTAIN, CULT, LABORGA ? ? ?No results found for: ALBUMIN, PREALBUMIN, CBC ? ?No results found for: MG ?No results found for: VD25OH ? ?No results found for: PREALBUMIN ?CBC EXTENDED Latest Ref Rng & Units 05/20/2021  ?WBC 4.0 - 10.5 K/uL 5.5  ?RBC 4.22 - 5.81 MIL/uL 5.48  ?HGB 13.0 - 17.0 g/dL 22.5  ?HCT 39.0 - 52.0 % 47.9  ?PLT 150 - 400 K/uL 263  ? ? ? ?There is no height or weight on file to calculate BMI. ? ?Orders:  ?Orders Placed This Encounter  ?Procedures  ? XR Foot Complete Left  ? ?Meds ordered this encounter   ?Medications  ? oxyCODONE-acetaminophen (PERCOCET/ROXICET) 5-325 MG tablet  ?  Sig: Take 1 tablet by mouth every 4 (four) hours as needed.  ?  Dispense:  30 tablet  ?  Refill:  0  ? ? ? Procedures: ?No procedures performed ? ?Clinical Data: ?No additional findings. ? ?ROS: ? ?All other systems negative, except as noted in the HPI. ?Review of Systems ? ?Objective: ?Vital Signs: There were no vitals taken for this visit. ? ?Specialty Comments:  ?No specialty comments available. ? ?PMFS History: ?Patient Active Problem List  ? Diagnosis Date Noted  ? Lisfranc dislocation, left, initial encounter   ? Contusion of bone 12/29/2020  ? ?History reviewed. No pertinent past medical history.  ?History reviewed. No pertinent family history.  ?Past Surgical History:  ?Procedure Laterality Date  ? FOOT ARTHRODESIS Left 05/20/2021  ? Procedure: LEFT MIDFOOT FUSION;  Surgeon: Nadara Mustard, MD;  Location: [facility] OR;  Service: Orthopedics;  Laterality: Left;  ? ?Social History  ? ?Occupational History  ? Not on file  ?Tobacco Use  ? Smoking status: Every Day  ?  Packs/day: 1.00  ?  Types: Cigarettes  ? Smokeless tobacco: Never  ?Vaping Use  ? Vaping Use: Never used  ?Substance and Sexual Activity  ? Alcohol use: Not Currently  ? Drug use: Not Currently  ? Sexual activity: Not on file  ? ? ? ? ? ?

## 2021-06-23 ENCOUNTER — Encounter: Payer: 59 | Admitting: Orthopedic Surgery

## 2021-06-30 ENCOUNTER — Ambulatory Visit (INDEPENDENT_AMBULATORY_CARE_PROVIDER_SITE_OTHER): Payer: No Typology Code available for payment source

## 2021-06-30 ENCOUNTER — Ambulatory Visit (INDEPENDENT_AMBULATORY_CARE_PROVIDER_SITE_OTHER): Payer: No Typology Code available for payment source | Admitting: Orthopedic Surgery

## 2021-06-30 ENCOUNTER — Encounter: Payer: Self-pay | Admitting: Orthopedic Surgery

## 2021-06-30 DIAGNOSIS — S93622A Sprain of tarsometatarsal ligament of left foot, initial encounter: Secondary | ICD-10-CM

## 2021-06-30 NOTE — Progress Notes (Signed)
? ?Office Visit Note ?  ?Patient: Steven Herring           ?Date of Birth: 1980-07-04           ?MRN: [id number] ?Visit Date: 06/30/2021 ?             ?Requested by: No referring provider defined for this encounter. ?PCP: Pcp, No ? ?Chief Complaint  ?Patient presents with  ? Left Foot - Routine Post Op  ?  05/20/21 left midfoot fusion for lisfranc Fx  ? ? ? ? ?HPI: ?Patient is a 41 year old gentleman who presents 6 weeks status post fusion of the base of the first metatarsal medial cuneiform internal fixation across the Lisfranc ligament for a Lisfranc injury.  Patient states that he has pain with range of motion of the great toe MTP joint states that the toe was numb medially complains of pain beneath the ball of his foot and has pain anteriorly over the ankle. ? ?Assessment & Plan: ?Visit Diagnoses:  ?1. Lisfranc's sprain, left, initial encounter   ? ? ?Plan: Patient will continue with a fracture boot and crutches ? ?Follow-Up Instructions: Return in about 4 weeks (around 07/28/2021).  ? ?Ortho Exam ? ?Patient is alert, oriented, no adenopathy, well-dressed, normal affect, normal respiratory effort. ?Examination patient has good range of motion the ankle however he does have tenderness to palpation anteriorly over the ankle and pain with maximal dorsiflexion.  He does have dorsiflexion past neutral.  Patient has pain reproduced with range of motion of the great toe MTP joint.  He also has callus beneath the third metatarsal head and pain to palpation beneath the third metatarsal head and this reproduces his forefoot symptoms.  Sensation is grossly intact around the great toe ? ?Imaging: ?XR Foot Complete Left ? ?Result Date: 06/30/2021 ?Three-view radiographs of the left foot shows stable internal fixation with fusion of the base of the first metatarsal medial cuneiform and internal fixation of the Lisfranc ligament.  There is no lucency or hardware failure.  ?No images are attached to the encounter. ?Nonweightbearing  on the left. ? ?Follow-up in 4 weeks obtain three-view radiographs of the left foot.  Discussed with the patient that we would return to modified work at that time and advance to full duty work as he feels comfortable.  Patient agrees with the treatment plan.  Note was provided for him to continue out of work for 4 weeks. ?Labs: ?No results found for: HGBA1C, ESRSEDRATE, CRP, LABURIC, REPTSTATUS, GRAMSTAIN, CULT, LABORGA ? ? ?No results found for: ALBUMIN, PREALBUMIN, CBC ? ?No results found for: MG ?No results found for: VD25OH ? ?No results found for: PREALBUMIN ? ?  Latest Ref Rng & Units 05/20/2021  ?  5:49 AM  ?CBC EXTENDED  ?WBC 4.0 - 10.5 K/uL 5.5    ?RBC 4.22 - 5.81 MIL/uL 5.48    ?Hemoglobin 13.0 - 17.0 g/dL 15.5    ?HCT 39.0 - 52.0 % 47.9    ?Platelets 150 - 400 K/uL 263    ? ? ? ?There is no height or weight on file to calculate BMI. ? ?Orders:  ?Orders Placed This Encounter  ?Procedures  ? XR Foot Complete Left  ? ?No orders of the defined types were placed in this encounter. ? ? ? Procedures: ?No procedures performed ? ?Clinical Data: ?No additional findings. ? ?ROS: ? ?All other systems negative, except as noted in the HPI. ?Review of Systems ? ?Objective: ?Vital Signs: There were no vitals taken for  this visit. ? ?Specialty Comments:  ?No specialty comments available. ? ?PMFS History: ?Patient Active Problem List  ? Diagnosis Date Noted  ? Lisfranc dislocation, left, initial encounter   ? Contusion of bone 12/29/2020  ? ?No past medical history on file.  ?No family history on file.  ?Past Surgical History:  ?Procedure Laterality Date  ? FOOT ARTHRODESIS Left 05/20/2021  ? Procedure: LEFT MIDFOOT FUSION;  Surgeon: Newt Minion, MD;  Location: [place];  Service: Orthopedics;  Laterality: Left;  ? ?Social History  ? ?Occupational History  ? Not on file  ?Tobacco Use  ? Smoking status: Every Day  ?  Packs/day: 1.00  ?  Types: Cigarettes  ? Smokeless tobacco: Never  ?Vaping Use  ? Vaping Use: Never used   ?Substance and Sexual Activity  ? Alcohol use: Not Currently  ? Drug use: Not Currently  ? Sexual activity: Not on file  ? ? ? ? ? ?

## 2021-08-02 ENCOUNTER — Ambulatory Visit (INDEPENDENT_AMBULATORY_CARE_PROVIDER_SITE_OTHER): Payer: No Typology Code available for payment source | Admitting: Orthopedic Surgery

## 2021-08-02 ENCOUNTER — Encounter: Payer: Self-pay | Admitting: Orthopedic Surgery

## 2021-08-02 ENCOUNTER — Ambulatory Visit (INDEPENDENT_AMBULATORY_CARE_PROVIDER_SITE_OTHER): Payer: No Typology Code available for payment source

## 2021-08-02 DIAGNOSIS — M79672 Pain in left foot: Secondary | ICD-10-CM | POA: Diagnosis not present

## 2021-08-02 DIAGNOSIS — S93622A Sprain of tarsometatarsal ligament of left foot, initial encounter: Secondary | ICD-10-CM

## 2021-08-02 NOTE — Progress Notes (Signed)
? ?Office Visit Note ?  ?Patient: Steven Herring           ?Date of Birth: 1980-05-16           ?MRN: [id number] ?Visit Date: 08/02/2021 ?             ?Requested by: No referring provider defined for this encounter. ?PCP: Pcp, No ? ?Chief Complaint  ?Patient presents with  ? Left Foot - Routine Post Op  ?  05/20/21 left midfoot fusion Lisfranc fx  ? ? ? ? ?HPI: ?Patient is a 41 year old gentleman who presents 2-1/2 months status post fusion and internal fixation for Lisfranc fracture dislocation left foot.  Patient is currently in fracture boot with crutches touchdown weightbearing.  Patient states he has numbness over the medial border of the great toe. ? ?Assessment & Plan: ?Visit Diagnoses:  ?1. Lisfranc's sprain, left, initial encounter   ?2. Pain in left foot   ? ? ?Plan: Patient is given a prescription for physical therapy for strengthening range of motion and progressive ambulation weightbearing as tolerated on the left lower extremity we will place him in a new postoperative shoe to facilitate his therapy.  Patient is given a note that he would continue out of work for 4 weeks anticipate maximal medical improvement at follow-up and return to work. ? ?Follow-Up Instructions: Return in about 4 weeks (around 08/30/2021).  ? ?Ortho Exam ? ?Patient is alert, oriented, no adenopathy, well-dressed, normal affect, normal respiratory effort. ?Examination the incision is well-healed patient has good sensation in the deep peroneal nerve distribution.  He moves his toes well but they are stiff with clawing.  He does have some decreased range of motion of the ankle with decreased dorsiflexion.  There is no redness or cellulitis.  Patient has tenderness to palpation over all of his toes.  Patient has numbness over the medial border of the great toe MTP joint.  There is no pain with range of motion of the toe.  Patient does have some periarticular cystic changes at the MTP joint. ? ?Imaging: ?XR Foot Complete Left ? ?Result  Date: 08/02/2021 ?Three-view radiographs of the left foot shows stable internal fixation of the fusion of the base of the first metatarsal medial cuneiform and stable internal fixation across the Lisfranc complex.  There is no hardware failure.  ?No images are attached to the encounter. ? ?Labs: ?No results found for: HGBA1C, ESRSEDRATE, CRP, LABURIC, REPTSTATUS, GRAMSTAIN, CULT, LABORGA ? ? ?No results found for: ALBUMIN, PREALBUMIN, CBC ? ?No results found for: MG ?No results found for: VD25OH ? ?No results found for: PREALBUMIN ? ?  Latest Ref Rng & Units 05/20/2021  ?  5:49 AM  ?CBC EXTENDED  ?WBC 4.0 - 10.5 K/uL 5.5    ?RBC 4.22 - 5.81 MIL/uL 5.48    ?Hemoglobin 13.0 - 17.0 g/dL 15.5    ?HCT 39.0 - 52.0 % 47.9    ?Platelets 150 - 400 K/uL 263    ? ? ? ?There is no height or weight on file to calculate BMI. ? ?Orders:  ?Orders Placed This Encounter  ?Procedures  ? XR Foot Complete Left  ? ?No orders of the defined types were placed in this encounter. ? ? ? Procedures: ?No procedures performed ? ?Clinical Data: ?No additional findings. ? ?ROS: ? ?All other systems negative, except as noted in the HPI. ?Review of Systems ? ?Objective: ?Vital Signs: There were no vitals taken for this visit. ? ?Specialty Comments:  ?No specialty comments  available. ? ?PMFS History: ?Patient Active Problem List  ? Diagnosis Date Noted  ? Lisfranc dislocation, left, initial encounter   ? Contusion of bone 12/29/2020  ? ?History reviewed. No pertinent past medical history.  ?History reviewed. No pertinent family history.  ?Past Surgical History:  ?Procedure Laterality Date  ? FOOT ARTHRODESIS Left 05/20/2021  ? Procedure: LEFT MIDFOOT FUSION;  Surgeon: Newt Minion, MD;  Location: [facility];  Service: Orthopedics;  Laterality: Left;  ? ?Social History  ? ?Occupational History  ? Not on file  ?Tobacco Use  ? Smoking status: Every Day  ?  Packs/day: 1.00  ?  Types: Cigarettes  ? Smokeless tobacco: Never  ?Vaping Use  ? Vaping Use: Never used   ?Substance and Sexual Activity  ? Alcohol use: Not Currently  ? Drug use: Not Currently  ? Sexual activity: Not on file  ? ? ? ? ? ?

## 2021-09-01 ENCOUNTER — Ambulatory Visit (INDEPENDENT_AMBULATORY_CARE_PROVIDER_SITE_OTHER): Payer: No Typology Code available for payment source | Admitting: Orthopedic Surgery

## 2021-09-01 DIAGNOSIS — S93622A Sprain of tarsometatarsal ligament of left foot, initial encounter: Secondary | ICD-10-CM | POA: Diagnosis not present

## 2021-09-02 ENCOUNTER — Encounter: Payer: Self-pay | Admitting: Orthopedic Surgery

## 2021-09-02 NOTE — Progress Notes (Signed)
Office Visit Note   Patient: Steven Herring           Date of Birth: 18-Mar-1981           MRN: [id number] Visit Date: 09/01/2021              Requested by: No referring provider defined for this encounter. PCP: Pcp, No  Chief Complaint  Patient presents with   Left Foot - Follow-up    Left midfoot fusion (lisfranc fx)      HPI: Patient is a 41 year old gentleman who presents 3-1/2 months status post left midfoot fusion for Lisfranc fracture dislocation he has been going to therapy weightbearing as tolerated still has some crutches states he still has pain and numbness.  Assessment & Plan: Visit Diagnoses:  1. Lisfranc's sprain, left, initial encounter     Plan: Patient would like to continue physical therapy we will have him continue therapy for 4 weeks.  Patient is provided a note that he may return to sedentary seated work.  Repeat three-view radiographs of the left foot at follow-up.  Follow-Up Instructions: Return in about 4 weeks (around 09/29/2021).   Ortho Exam  Patient is alert, oriented, no adenopathy, well-dressed, normal affect, normal respiratory effort. Examination patient complains of pain with light touch.  There is no dystrophic changes no temperature changes no color changes the skin has normal texture.  There is no hypertrophy to the scar.  Patient states he is not ready to go back to work.  Imaging: No results found. No images are attached to the encounter.  Labs: No results found for: HGBA1C, ESRSEDRATE, CRP, LABURIC, REPTSTATUS, GRAMSTAIN, CULT, LABORGA   No results found for: ALBUMIN, PREALBUMIN, CBC  No results found for: MG No results found for: VD25OH  No results found for: PREALBUMIN    Latest Ref Rng & Units 05/20/2021    5:49 AM  CBC EXTENDED  WBC 4.0 - 10.5 K/uL 5.5    RBC 4.22 - 5.81 MIL/uL 5.48    Hemoglobin 13.0 - 17.0 g/dL 15.5    HCT 39.0 - 52.0 % 47.9    Platelets 150 - 400 K/uL 263       There is no height or weight on  file to calculate BMI.  Orders:  No orders of the defined types were placed in this encounter.  No orders of the defined types were placed in this encounter.    Procedures: No procedures performed  Clinical Data: No additional findings.  ROS:  All other systems negative, except as noted in the HPI. Review of Systems  Objective: Vital Signs: There were no vitals taken for this visit.  Specialty Comments:  No specialty comments available.  PMFS History: Patient Active Problem List   Diagnosis Date Noted   Lisfranc dislocation, left, initial encounter    Contusion of bone 12/29/2020   History reviewed. No pertinent past medical history.  History reviewed. No pertinent family history.  Past Surgical History:  Procedure Laterality Date   FOOT ARTHRODESIS Left 05/20/2021   Procedure: LEFT MIDFOOT FUSION;  Surgeon: Newt Minion, MD;  Location: [place] [facility];  Service: Orthopedics;  Laterality: Left;   Social History   Occupational History   Not on file  Tobacco Use   Smoking status: Every Day    Packs/day: 1.00    Types: Cigarettes   Smokeless tobacco: Never  Vaping Use   Vaping Use: Never used  Substance and Sexual Activity   Alcohol use: Not Currently  Drug use: Not Currently   Sexual activity: Not on file

## 2021-09-29 ENCOUNTER — Ambulatory Visit (INDEPENDENT_AMBULATORY_CARE_PROVIDER_SITE_OTHER): Payer: No Typology Code available for payment source | Admitting: Orthopedic Surgery

## 2021-09-29 ENCOUNTER — Ambulatory Visit (INDEPENDENT_AMBULATORY_CARE_PROVIDER_SITE_OTHER): Payer: No Typology Code available for payment source

## 2021-09-29 ENCOUNTER — Encounter: Payer: Self-pay | Admitting: Orthopedic Surgery

## 2021-09-29 DIAGNOSIS — S93622A Sprain of tarsometatarsal ligament of left foot, initial encounter: Secondary | ICD-10-CM

## 2021-09-29 MED ORDER — OXYCODONE-ACETAMINOPHEN 5-325 MG PO TABS: 1.0000 | ORAL_TABLET | Freq: Three times a day (TID) | ORAL | 0 refills | Status: DC | PRN

## 2021-09-29 NOTE — Progress Notes (Signed)
Office Visit Note   Patient: Steven Herring           Date of Birth: Apr 16, 1980           MRN: [id number] Visit Date: 09/29/2021              Requested by: No referring provider defined for this encounter. PCP: Pcp, No  Chief Complaint  Patient presents with   Left Foot - Follow-up    05/20/21 left midfoot fusion      HPI: Patient is a 41 year old gentleman who is 4 months status post fusion across the Lisfranc fracture for Lisfranc injury.  Patient states he feels like he is coming along.  He states he feels like physical therapy has been overly aggressive.  Assessment & Plan: Visit Diagnoses:  1. Lisfranc's sprain, left, initial encounter     Plan: Patient will continue with physical therapy for 4 weeks continue with seated work for 4 weeks a prescription for Percocet is called in.  He will use crutches as needed.  Follow-Up Instructions: No follow-ups on file.   Ortho Exam  Patient is alert, oriented, no adenopathy, well-dressed, normal affect, normal respiratory effort. Examination patient has good pulses he still has numbness around the great toe.  There is no tenderness to palpation across the midfoot.  Patient states he has pain after about an hour of walking.  There are no dystrophic changes.  Imaging: XR Foot Complete Left  Result Date: 09/29/2021 Three-view radiographs of the left foot shows stable alignment.  There is lucency around the screws without fractures.  No displacement.  No images are attached to the encounter.  Labs: No results found for: "HGBA1C", "ESRSEDRATE", "CRP", "LABURIC", "REPTSTATUS", "GRAMSTAIN", "CULT", "LABORGA"   No results found for: "ALBUMIN", "PREALBUMIN", "CBC"  No results found for: "MG" No results found for: "VD25OH"  No results found for: "PREALBUMIN"    Latest Ref Rng & Units 05/20/2021    5:49 AM  CBC EXTENDED  WBC 4.0 - 10.5 K/uL 5.5   RBC 4.22 - 5.81 MIL/uL 5.48   Hemoglobin 13.0 - 17.0 g/dL 37.1   HCT 06.2 - 69.4  % 47.9   Platelets 150 - 400 K/uL 263      There is no height or weight on file to calculate BMI.  Orders:  Orders Placed This Encounter  Procedures   XR Foot Complete Left   Meds ordered this encounter  Medications   oxyCODONE-acetaminophen (PERCOCET/ROXICET) 5-325 MG tablet    Sig: Take 1 tablet by mouth every 8 (eight) hours as needed.    Dispense:  20 tablet    Refill:  0     Procedures: No procedures performed  Clinical Data: No additional findings.  ROS:  All other systems negative, except as noted in the HPI. Review of Systems  Objective: Vital Signs: There were no vitals taken for this visit.  Specialty Comments:  No specialty comments available.  PMFS History: Patient Active Problem List   Diagnosis Date Noted   Lisfranc dislocation, left, initial encounter    Contusion of bone 12/29/2020   History reviewed. No pertinent past medical history.  History reviewed. No pertinent family history.  Past Surgical History:  Procedure Laterality Date   FOOT ARTHRODESIS Left 05/20/2021   Procedure: LEFT MIDFOOT FUSION;  Surgeon: Nadara Mustard, MD;  Location: [facility] OR;  Service: Orthopedics;  Laterality: Left;   Social History   Occupational History   Not on file  Tobacco Use   Smoking  status: Every Day    Packs/day: 1.00    Types: Cigarettes   Smokeless tobacco: Never  Vaping Use   Vaping Use: Never used  Substance and Sexual Activity   Alcohol use: Not Currently   Drug use: Not Currently   Sexual activity: Not on file

## 2021-11-03 ENCOUNTER — Ambulatory Visit (INDEPENDENT_AMBULATORY_CARE_PROVIDER_SITE_OTHER): Payer: No Typology Code available for payment source | Admitting: Orthopedic Surgery

## 2021-11-03 ENCOUNTER — Ambulatory Visit (INDEPENDENT_AMBULATORY_CARE_PROVIDER_SITE_OTHER): Payer: 59

## 2021-11-03 ENCOUNTER — Encounter: Payer: Self-pay | Admitting: Orthopedic Surgery

## 2021-11-03 DIAGNOSIS — M79672 Pain in left foot: Secondary | ICD-10-CM

## 2021-11-03 DIAGNOSIS — S93622A Sprain of tarsometatarsal ligament of left foot, initial encounter: Secondary | ICD-10-CM | POA: Diagnosis not present

## 2021-11-03 MED ORDER — OXYCODONE-ACETAMINOPHEN 5-325 MG PO TABS: 1.0000 | ORAL_TABLET | Freq: Three times a day (TID) | ORAL | 0 refills | Status: DC | PRN

## 2021-11-03 NOTE — Progress Notes (Signed)
Office Visit Note   Patient: Steven Herring           Date of Birth: 1980/05/11           MRN: [id number] Visit Date: 11/03/2021              Requested by: No referring provider defined for this encounter. PCP: Pcp, No  Chief Complaint  Patient presents with   Left Foot - Follow-up    05/20/21 left midfoot fusion      HPI: Patient is a 41 year old gentleman who is 5-1/2 months status post left midfoot fusion for Lisfranc injury.  Patient is making good progress with therapy.  Assessment & Plan: Visit Diagnoses:  1. Pain in left foot   2. Lisfranc's sprain, left, initial encounter     Plan: Patient was provided a note to continue physical therapy for 2 visits a week for 5 weeks.  Patient is also provided a note that he may resume regular duties without restrictions.  Refill prescription for his Percocet.  Follow-Up Instructions: Return in about 4 weeks (around 12/01/2021).   Ortho Exam  Patient is alert, oriented, no adenopathy, well-dressed, normal affect, normal respiratory effort. Examination patient has a good pulse.  He still has numbness in the first webspace left foot.  The incision is well-healed no swelling no cellulitis.  Imaging: XR Foot Complete Left  Result Date: 11/03/2021 Three-view radiographs of the left foot shows stable hardware without hardware failure.  There is stable  No images are attached to the encounter.  Labs: No results found for: "HGBA1C", "ESRSEDRATE", "CRP", "LABURIC", "REPTSTATUS", "GRAMSTAIN", "CULT", "LABORGA"   No results found for: "ALBUMIN", "PREALBUMIN", "CBC"  No results found for: "MG" No results found for: "VD25OH"  No results found for: "PREALBUMIN"    Latest Ref Rng & Units 05/20/2021    5:49 AM  CBC EXTENDED  WBC 4.0 - 10.5 K/uL 5.5   RBC 4.22 - 5.81 MIL/uL 5.48   Hemoglobin 13.0 - 17.0 g/dL 08.6   HCT 76.1 - 95.0 % 47.9   Platelets 150 - 400 K/uL 263      There is no height or weight on file to calculate  BMI.  Orders:  Orders Placed This Encounter  Procedures   XR Foot Complete Left   No orders of the defined types were placed in this encounter.    Procedures: No procedures performed  Clinical Data: No additional findings.  ROS:  All other systems negative, except as noted in the HPI. Review of Systems  Objective: Vital Signs: There were no vitals taken for this visit.  Specialty Comments:  No specialty comments available.  PMFS History: Patient Active Problem List   Diagnosis Date Noted   Lisfranc dislocation, left, initial encounter    Contusion of bone 12/29/2020   History reviewed. No pertinent past medical history.  History reviewed. No pertinent family history.  Past Surgical History:  Procedure Laterality Date   FOOT ARTHRODESIS Left 05/20/2021   Procedure: LEFT MIDFOOT FUSION;  Surgeon: Nadara Mustard, MD;  Location: [facility];  Service: Orthopedics;  Laterality: Left;   Social History   Occupational History   Not on file  Tobacco Use   Smoking status: Every Day    Packs/day: 1.00    Types: Cigarettes   Smokeless tobacco: Never  Vaping Use   Vaping Use: Never used  Substance and Sexual Activity   Alcohol use: Not Currently   Drug use: Not Currently   Sexual activity: Not  on file

## 2021-11-22 ENCOUNTER — Ambulatory Visit: Payer: 59 | Admitting: Family

## 2021-11-28 ENCOUNTER — Ambulatory Visit (INDEPENDENT_AMBULATORY_CARE_PROVIDER_SITE_OTHER): Payer: No Typology Code available for payment source | Admitting: Orthopedic Surgery

## 2021-11-28 ENCOUNTER — Encounter: Payer: Self-pay | Admitting: Orthopedic Surgery

## 2021-11-28 DIAGNOSIS — M79672 Pain in left foot: Secondary | ICD-10-CM | POA: Diagnosis not present

## 2021-11-28 DIAGNOSIS — S93622A Sprain of tarsometatarsal ligament of left foot, initial encounter: Secondary | ICD-10-CM
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
## 2021-11-29 ENCOUNTER — Ambulatory Visit: Payer: 59 | Admitting: Orthopedic Surgery

## 2021-12-22 ENCOUNTER — Telehealth: Payer: Self-pay | Admitting: Orthopedic Surgery

## 2021-12-22 ENCOUNTER — Emergency Department (HOSPITAL_BASED_OUTPATIENT_CLINIC_OR_DEPARTMENT_OTHER): Payer: Self-pay

## 2021-12-22 ENCOUNTER — Other Ambulatory Visit: Payer: Self-pay

## 2021-12-22 ENCOUNTER — Emergency Department (HOSPITAL_BASED_OUTPATIENT_CLINIC_OR_DEPARTMENT_OTHER)
Admission: EM | Admit: 2021-12-22 | Discharge: 2021-12-22 | Disposition: A | Discharge status: Home / Self Care | Payer: Self-pay | Attending: Emergency Medicine | Admitting: Emergency Medicine

## 2021-12-22 ENCOUNTER — Encounter (HOSPITAL_BASED_OUTPATIENT_CLINIC_OR_DEPARTMENT_OTHER): Payer: Self-pay | Admitting: Emergency Medicine

## 2021-12-22 DIAGNOSIS — R0789 Other chest pain: Secondary | ICD-10-CM | POA: Insufficient documentation

## 2021-12-22 LAB — CBC
HCT: 45.5 % (ref 39.0–52.0)
Hemoglobin: 14.8 g/dL (ref 13.0–17.0)
MCH: 28.1 pg (ref 26.0–34.0)
MCHC: 32.5 g/dL (ref 30.0–36.0)
MCV: 86.3 fL (ref 80.0–100.0)
Platelets: 232 10*3/uL (ref 150–400)
RBC: 5.27 MIL/uL (ref 4.22–5.81)
RDW: 13.6 % (ref 11.5–15.5)
WBC: 5.2 10*3/uL (ref 4.0–10.5)
nRBC: 0 % (ref 0.0–0.2)

## 2021-12-22 LAB — BASIC METABOLIC PANEL
Anion gap: 7 (ref 5–15)
BUN: 14 mg/dL (ref 6–20)
CO2: 24 mmol/L (ref 22–32)
Calcium: 8.9 mg/dL (ref 8.9–10.3)
Chloride: 106 mmol/L (ref 98–111)
Creatinine, Ser: 1.29 mg/dL — ABNORMAL HIGH (ref 0.61–1.24)
GFR, Estimated: >60 mL/min (ref >60)
Glucose, Bld: 99 mg/dL (ref 70–99)
Potassium: 3.9 mmol/L (ref 3.5–5.1)
Sodium: 137 mmol/L (ref 135–145)

## 2021-12-22 LAB — TROPONIN I (HIGH SENSITIVITY)
Troponin I (High Sensitivity): 6 ng/L (ref <18)
Troponin I (High Sensitivity): 7 ng/L (ref <18)

## 2021-12-22 MED ORDER — MORPHINE SULFATE (PF) 4 MG/ML IV SOLN
4.0000 mg | Freq: Once | INTRAVENOUS | Status: DC
  Filled 2021-12-22: qty 1

## 2021-12-22 MED ORDER — IOHEXOL 350 MG/ML SOLN
75.0000 mL | Freq: Once | INTRAVENOUS | Status: AC | PRN
  Administered 2021-12-22: 75 mL via INTRAVENOUS

## 2021-12-22 NOTE — Telephone Encounter (Signed)
shannon miller bench [facility] pt [phone] [id number]. Wants to update Dr Sharol Given on pt test that he had today.

## 2021-12-22 NOTE — ED Provider Notes (Signed)
[facility] EMERGENCY DEPARTMENT Provider Note   CSN: [id number] Arrival date & time: 12/22/21  1759     History  Chief Complaint  Patient presents with   Chest Pain    Steven Herring is a 41 y.o. male here presenting with chest pain and shortness of breath.  Patient had crush injury to the left foot about a year ago.  Patient had surgery and then is in physical therapy.  Patient was lifting some weights during physical therapy today.  He states that he lifted 40 pounds.  He then had some shortness of breath and chest pressure.  Patient is on oxycodone for pain at baseline.  Patient was sent here for further evaluation.  Patient denies any history of blood clots and denies any calf pain.  The history is provided by the patient.       Home Medications Prior to Admission medications   Medication Sig Start Date End Date Taking? Authorizing Provider  ibuprofen (ADVIL) 800 MG tablet Take 1 tablet (800 mg total) by mouth every 8 (eight) hours as needed. 03/17/21   Nadara Mustard, MD  oxyCODONE-acetaminophen (PERCOCET/ROXICET) 5-325 MG tablet Take 1 tablet by mouth every 8 (eight) hours as needed. 11/03/21   Nadara Mustard, MD      Allergies    Patient has no known allergies.    Review of Systems   Review of Systems  Cardiovascular:  Positive for chest pain.  All other systems reviewed and are negative.   Physical Exam Updated Vital Signs BP 119/77   Pulse (!) 44   Temp 98.4 F (36.9 C) (Oral)   Resp (!) 25   Ht 5\' 8"  (1.727 m)   Wt 90.7 kg   SpO2 98%   BMI 30.41 kg/m  Physical Exam Vitals and nursing note reviewed.  Constitutional:      Comments: Slightly uncomfortable   HENT:     Head: Normocephalic.  Eyes:     Extraocular Movements: Extraocular movements intact.     Pupils: Pupils are equal, round, and reactive to light.  Cardiovascular:     Rate and Rhythm: Normal rate and regular rhythm.     Heart sounds: Normal heart sounds.  Pulmonary:      Effort: Pulmonary effort is normal.     Breath sounds: Normal breath sounds.  Abdominal:     General: Bowel sounds are normal.     Palpations: Abdomen is soft.  Musculoskeletal:        General: Normal range of motion.     Cervical back: Normal range of motion and neck supple.     Comments: Left foot slightly tender.  No obvious purulent discharge or signs of cellulitis  Skin:    General: Skin is warm.     Capillary Refill: Capillary refill takes less than 2 seconds.  Neurological:     General: No focal deficit present.     Mental Status: He is oriented to person, place, and time.  Psychiatric:        Mood and Affect: Mood normal.        Behavior: Behavior normal.     ED Results / Procedures / Treatments   Labs (all labs ordered are listed, but only abnormal results are displayed) Labs Reviewed  BASIC METABOLIC PANEL - Abnormal; Notable for the following components:      Result Value   Creatinine, Ser 1.29 (*)    All other components within normal limits  CBC  TROPONIN I (HIGH  SENSITIVITY)  TROPONIN I (HIGH SENSITIVITY)    EKG EKG Interpretation  Date/Time:  Thursday December 22 2021 18:07:42 EDT Ventricular Rate:  59 PR Interval:  138 QRS Duration: 94 QT Interval:  390 QTC Calculation: 386 R Axis:   73 Text Interpretation: Sinus bradycardia Minimal voltage criteria for LVH, may be normal variant ( Sokolow-Lyon ) Early repolarization Borderline ECG When compared with ECG of 20-May-2021 09:13, PREVIOUS ECG IS PRESENT Confirmed by Wandra Arthurs [id number]) on 12/22/2021 7:56:38 PM  Radiology DG Chest 2 View  Result Date: 12/22/2021 CLINICAL DATA:  Chest pain. EXAM: CHEST - 2 VIEW COMPARISON:  None Available. FINDINGS: The heart size and mediastinal contours are within normal limits. Both lungs are clear. The visualized skeletal structures are unremarkable. IMPRESSION: No active cardiopulmonary disease. Electronically Signed   By: Anner Crete M.D.   On: 12/22/2021 18:31     Procedures Procedures    Medications Ordered in ED Medications  morphine (PF) 4 MG/ML injection 4 mg (has no administration in time range)    ED Course/ Medical Decision Making/ A&P                           Medical Decision Making Steven Herring is a 41 y.o. male here presenting with chest pain.  Patient was at physical therapy and had some chest pain and shortness of breath.  Patient does have left foot crush injury about a year ago and has not been the most mobile.  Patient is not tachycardic but I have high suspicion for PE.  Plan to get CTA chest and CBC and CMP and troponin x 2.   10:23 PM I reviewed patient's labs and independently interpreted imaging studies.  Troponin negative x2.  CTA showed no PE.  I think likely musculoskeletal pain.  He has Percocet at home.  Stable for discharge.  Problems Addressed: Chest wall pain: acute illness or injury  Amount and/or Complexity of Data Reviewed Labs: ordered. Decision-making details documented in ED Course. Radiology: ordered and independent interpretation performed. Decision-making details documented in ED Course. ECG/medicine tests: ordered and independent interpretation performed. Decision-making details documented in ED Course.  Risk Prescription drug management.    Final Clinical Impression(s) / ED Diagnoses Final diagnoses:  None    Rx / DC Orders ED Discharge Orders     None         Drenda Freeze, MD 12/22/21 2230

## 2021-12-22 NOTE — ED Provider Notes (Signed)
Ultrasound ED Peripheral IV (Provider)  Date/Time: 12/22/2021 8:23 PM  Performed by: Osvaldo Shipper, PA Authorized by: Osvaldo Shipper, PA   Procedure details:    Indications: multiple failed IV attempts     Skin Prep: chlorhexidine gluconate     Location:  Left AC   Angiocath:  20 G   Bedside Ultrasound Guided: Yes     Images: not archived     Patient tolerated procedure without complications: Yes     Dressing applied: Yes       Osvaldo Shipper, PA 12/22/21 2024    Drenda Freeze, MD 12/22/21 2322

## 2021-12-22 NOTE — ED Triage Notes (Signed)
Pt c/o intermittent central chest pain that started while lifting weights during his physical therapy appointment today. Rates pain 7/10. Denies associated radiation, nausea, diaphoresis, weakness.

## 2021-12-22 NOTE — Discharge Instructions (Signed)
Your CT scan and your heart enzymes are normal.  Please continue taking Percocet for pain as prescribed by your doctor.  Follow-up with your primary care doctor.  I recommend that you do less in physical therapy right now  Return to ER if you have worse chest pain, shortness of breath

## 2021-12-23 NOTE — Telephone Encounter (Signed)
Spoke with Larene Beach. They started an FCE yesterday, HR starting 55-60, during walking HR dropped to 40 and he felt tightness in his chest. They were unable to finish the FCE. He told PT he doesn't really follow up with a primary physician. FYI.

## 2021-12-29 ENCOUNTER — Encounter: Payer: Self-pay | Admitting: Orthopedic Surgery

## 2021-12-29 ENCOUNTER — Ambulatory Visit: Payer: 59 | Admitting: Orthopedic Surgery

## 2021-12-29 ENCOUNTER — Ambulatory Visit (INDEPENDENT_AMBULATORY_CARE_PROVIDER_SITE_OTHER): Payer: No Typology Code available for payment source | Admitting: Orthopedic Surgery

## 2021-12-29 DIAGNOSIS — M79672 Pain in left foot: Secondary | ICD-10-CM

## 2021-12-29 DIAGNOSIS — R0789 Other chest pain: Secondary | ICD-10-CM

## 2021-12-29 DIAGNOSIS — S93622A Sprain of tarsometatarsal ligament of left foot, initial encounter: Secondary | ICD-10-CM | POA: Diagnosis not present

## 2021-12-29 NOTE — Progress Notes (Signed)
Office Visit Note   Patient: Steven Herring           Date of Birth: [birth year]           MRN: [id number] Visit Date: 12/29/2021              Requested by: No referring provider defined for this encounter. PCP: Pcp, No  Chief Complaint  Patient presents with   Left Foot - Follow-up    05/20/21 left midfoot fusion      HPI: Patient is a 41 year old gentleman who is over 7 months status post left midfoot fusion for Lisfranc injury.  Patient has had persistent symptoms and was set up for a functional capacity evaluation.  Of note on the functional capacity evaluation patient gave a self limited effort during testing and patient's movement and psychological responses were inconsistent with maximal effort.  There was inconsistency between the test results and the referral diagnosis.  Patient states he did have some slight chest tightness during his functional capacity evaluation.  Patient denies any previous history of chest discomfort.  Assessment & Plan: Visit Diagnoses:  1. Pain in left foot   2. Lisfranc's sprain, left, initial encounter     Plan: With patient's complaint of chest tightness his function capacity evaluation we will set him up for an appointment with cardiology.  Patient has reached his maximal medical improvement.  Patient is released with permanent restriction of light duty work with working 8 hours a day with maximal lifting floor to waist 45 pounds, waist to shoulder 20 pound left, shoulder to overhead lift 20 pounds, and two-handed carry 20 pounds.  In review of the [place] decile guidelines for permanent partial impairment patient's permanent impairment for the left foot would be 10% for limitation of motion of the foot.  Follow-Up Instructions: Return if symptoms worsen or fail to improve.   Ortho Exam  Patient is alert, oriented, no adenopathy, well-dressed, normal affect, normal respiratory effort. Examination patient has a normal gait.  He has  decreased range of motion of the subtalar joint with 20 degrees inversion and eversion.  He has dorsiflexion to neutral of the ankle.  There are no open wounds no cellulitis.  Previous radiographs shows a stable fusion across the base of the first metatarsal.  Imaging: No results found. No images are attached to the encounter.  Labs: No results found for: "HGBA1C", "ESRSEDRATE", "CRP", "LABURIC", "REPTSTATUS", "GRAMSTAIN", "CULT", "LABORGA"   No results found for: "ALBUMIN", "PREALBUMIN", "CBC"  No results found for: "MG" No results found for: "VD25OH"  No results found for: "PREALBUMIN"    Latest Ref Rng & Units 12/22/2021    6:20 PM 05/20/2021    5:49 AM  CBC EXTENDED  WBC 4.0 - 10.5 K/uL 5.2  5.5   RBC 4.22 - 5.81 MIL/uL 5.27  5.48   Hemoglobin 13.0 - 17.0 g/dL 14.8  15.5   HCT 39.0 - 52.0 % 45.5  47.9   Platelets 150 - 400 K/uL 232  263      There is no height or weight on file to calculate BMI.  Orders:  No orders of the defined types were placed in this encounter.  No orders of the defined types were placed in this encounter.    Procedures: No procedures performed  Clinical Data: No additional findings.  ROS:  All other systems negative, except as noted in the HPI. Review of Systems  Objective: Vital Signs: There were no vitals taken for this visit.  Specialty Comments:  No specialty comments available.  PMFS History: Patient Active Problem List   Diagnosis Date Noted   Lisfranc dislocation, left, initial encounter    Contusion of bone 12/29/2020   History reviewed. No pertinent past medical history.  History reviewed. No pertinent family history.  Past Surgical History:  Procedure Laterality Date   FOOT ARTHRODESIS Left 05/20/2021   Procedure: LEFT MIDFOOT FUSION;  Surgeon: Newt Minion, MD;  Location: [facility];  Service: Orthopedics;  Laterality: Left;   Social History   Occupational History   Not on file  Tobacco Use   Smoking status: Every  Day    Packs/day: 1.00    Types: Cigarettes   Smokeless tobacco: Never  Vaping Use   Vaping Use: Never used  Substance and Sexual Activity   Alcohol use: Not Currently   Drug use: Not Currently   Sexual activity: Yes

## 2021-12-29 NOTE — Addendum Note (Signed)
Addended by: Mariana Arn L on: 12/29/2021 01:00 PM   Modules accepted: Orders

## 2021-12-29 NOTE — Telephone Encounter (Signed)
Dr. Sharol Given placed referral to cardiology today.

## 2021-12-29 NOTE — Telephone Encounter (Signed)
Pt is coming in today for a follow up appt and will address at that time.

## 2021-12-30 ENCOUNTER — Ambulatory Visit: Payer: Self-pay | Admitting: Cardiology

## 2022-01-26 ENCOUNTER — Ambulatory Visit: Payer: No Typology Code available for payment source | Attending: Cardiology | Admitting: Cardiology

## 2022-01-26 ENCOUNTER — Encounter: Payer: Self-pay | Admitting: Cardiology

## 2022-01-26 VITALS — BP 110/60 | HR 58 | Ht 68.0 in | Wt 195.0 lb

## 2022-01-26 DIAGNOSIS — R9431 Abnormal electrocardiogram [ECG] [EKG]: Secondary | ICD-10-CM | POA: Diagnosis not present

## 2022-01-26 DIAGNOSIS — R072 Precordial pain: Secondary | ICD-10-CM

## 2022-01-26 NOTE — Patient Instructions (Signed)
Medication Instructions:  The current medical regimen is effective;  continue present plan and medications.  *If you need a refill on your cardiac medications before your next appointment, please call your pharmacy*  Testing/Procedures:   Your cardiac CT will be scheduled at:   [facility] [address] [place], [place] [address] [phone]  Please arrive at the [facility] and Children's Entrance (Entrance C2) of [facility] 30 minutes prior to test start time. You can use the FREE valet parking offered at entrance C (encouraged to control the heart rate for the test)  Proceed to the [facility] Radiology Department (first floor) to check-in and test prep.  All radiology patients and guests should use entrance C2 at [facility], accessed from [facility], even though the hospital's physical address listed is [address].    Please follow these instructions carefully (unless otherwise directed):  Hold all erectile dysfunction medications at least 3 days (72 hrs) prior to test. (Ie viagra, cialis, sildenafil, tadalafil, etc) We will administer nitroglycerin during this exam.   On the Night Before the Test: Be sure to Drink plenty of water. Do not consume any caffeinated/decaffeinated beverages or chocolate 12 hours prior to your test. Do not take any antihistamines 12 hours prior to your test.  On the Day of the Test: Drink plenty of water until 1 hour prior to the test. Do not eat any food 1 hour prior to test. You may take your regular medications prior to the test.  Take metoprolol (Lopressor) two hours prior to test. HOLD Furosemide/Hydrochlorothiazide morning of the test.      After the Test: Drink plenty of water. After receiving IV contrast, you may experience a mild flushed feeling. This is normal. On occasion, you may experience a mild rash up to 24 hours after the test. This is not dangerous. If this occurs, you can  take Benadryl 25 mg and increase your fluid intake. If you experience trouble breathing, this can be serious. If it is severe call 911 IMMEDIATELY. If it is mild, please call our office.  We will call to schedule your test 2-4 weeks out understanding that some insurance companies will need an authorization prior to the service being performed.   For non-scheduling related questions, please contact the cardiac imaging nurse navigator should you have any questions/concerns: Marchia Bond, Cardiac Imaging Nurse Navigator Gordy Clement, Cardiac Imaging Nurse Navigator [facility] Direct Office Dial: [phone]   For scheduling needs, including cancellations and rescheduling, please call [place], [phone].  Follow-Up: At [facility], you and your health needs are our priority.  As part of our continuing mission to provide you with exceptional heart care, we have created designated Provider Care Teams.  These Care Teams include your primary Cardiologist (physician) and Advanced Practice Providers (APPs -  Physician Assistants and Nurse Practitioners) who all work together to provide you with the care you need, when you need it.  We recommend signing up for the patient portal called "MyChart".  Sign up information is provided on this After Visit Summary.  MyChart is used to connect with patients for Virtual Visits (Telemedicine).  Patients are able to view lab/test results, encounter notes, upcoming appointments, etc.  Non-urgent messages can be sent to your provider as well.   To learn more about what you can do with MyChart, go to [email or web address].    Your next appointment:   Follow up will be based on  the results of the above testing.   Important Information About Sugar

## 2022-01-26 NOTE — Progress Notes (Signed)
Cardiology Office Note:    Date:  01/26/2022   ID:  Steven Herring, DOB April 06, 1980, MRN [id number]  PCP:  Pcp, No   [facility] Providers Cardiologist:  None     Referring MD: Steven Mustard, MD   History of Present Illness:    Steven Herring is a 41 y.o. male here for the evaluation of chest wall pain.  During his previous ED visit he had presented with chest pain and shortness of breath. Patient had crush injury to the left foot about a year ago which resulted in surgery. He was lifting some weights during physical therapy 12/22/2021.  He stated that he lifted 40 pounds. He then had some shortness of breath and chest pressure.   Today the patient states that he has been doing ok. He had been doing physical therapy after his left foot fusion. His chest was bothering him when he exerted himself. He just started feeling the chest pain recently for the last few months when he gets active.  He denies taking any medicines.  He is an occasional smoker, and doesn't drink  In regards to his family his sister had a kidney transplant.  He denies any palpitations, or shortness of breath. No lightheadedness, headaches, syncope, orthopnea, or PND.   No past medical history on file.  Past Surgical History:  Procedure Laterality Date   FOOT ARTHRODESIS Left 05/20/2021   Procedure: LEFT MIDFOOT FUSION;  Surgeon: Steven Mustard, MD;  Location: [facility];  Service: Orthopedics;  Laterality: Left;    Current Medications: Current Meds  Medication Sig   ibuprofen (ADVIL) 800 MG tablet Take 1 tablet (800 mg total) by mouth every 8 (eight) hours as needed.   oxyCODONE-acetaminophen (PERCOCET/ROXICET) 5-325 MG tablet Take 1 tablet by mouth every 8 (eight) hours as needed.     Allergies:   Patient has no known allergies.   Social History   Socioeconomic History   Marital status: Single    Spouse name: Not on file   Number of children: Not on file   Years of education: Not on file   Highest  education level: Not on file  Occupational History   Not on file  Tobacco Use   Smoking status: Every Day    Packs/day: 1.00    Types: Cigarettes   Smokeless tobacco: Never  Vaping Use   Vaping Use: Never used  Substance and Sexual Activity   Alcohol use: Not Currently   Drug use: Not Currently   Sexual activity: Yes  Other Topics Concern   Not on file  Social History Narrative   Not on file   Social Determinants of Health   Financial Resource Strain: Not on file  Food Insecurity: Not on file  Transportation Needs: Not on file  Physical Activity: Not on file  Stress: Not on file  Social Connections: Not on file     Family History: The patient's family history is not on file.  ROS:   Please see the history of present illness.    (+) Chest Pain All other systems reviewed and are negative.  EKGs/Labs/Other Studies Reviewed:    The following studies were reviewed today:  CT Angio Chest 12/22/2021:  Cardiovascular: The thoracic aorta is normal in appearance. Satisfactory opacification of the pulmonary arteries to the segmental level. No evidence of pulmonary embolism. Normal heart size. No pericardial effusion.  IMPRESSION: 1. No evidence of pulmonary embolism or other acute intrathoracic process.  EKG:  EKG is personally reviewed  01/26/2022:  The EKG is not ordered 12/22/2021 (Dr. Darl Householder):  Sinus bradycardia 59 bpm Minimal voltage criteria for LVH, may be normal variant ( Sokolow-Lyon ) Early repolarization  Recent Labs: 12/22/2021: BUN 14; Creatinine, Ser 1.29; Hemoglobin 14.8; Platelets 232; Potassium 3.9; Sodium 137   Recent Lipid Panel No results found for: "CHOL", "TRIG", "HDL", "CHOLHDL", "VLDL", "LDLCALC", "LDLDIRECT"   Risk Assessment/Calculations:          Physical Exam:    VS:  BP 110/60 (BP Location: Left Arm, Patient Position: Sitting, Cuff Size: Normal)   Pulse (!) 58   Ht 5\' 8"  (1.727 m)   Wt 195 lb (88.5 kg)   SpO2 98%   BMI 29.65 kg/m      Wt Readings from Last 3 Encounters:  01/26/22 195 lb (88.5 kg)  12/22/21 200 lb (90.7 kg)  05/20/21 210 lb (95.3 kg)     GEN: Well nourished, well developed in no acute distress HEENT: Normal NECK: No JVD; No carotid bruits LYMPHATICS: No lymphadenopathy CARDIAC:  RRR, no murmurs, rubs, gallops RESPIRATORY:  Clear to auscultation without rales, wheezing or rhonchi  ABDOMEN: Soft, non-tender, non-distended MUSCULOSKELETAL:  No edema; No deformity  SKIN: Warm and dry NEUROLOGIC:  Alert and oriented x 3 PSYCHIATRIC:  Normal affect   ASSESSMENT:    1. Precordial chest pain   2. Nonspecific abnormal electrocardiogram (ECG) (EKG)    PLAN:    In order of problems listed above:   Precordial chest pain Abnormal EKG - We will check a coronary CT scan with possible FFR analysis.  His chest pain may be musculoskeletal especially if he was using crutches however I think it makes sense for [place] to get a thorough cardiovascular evaluation. -LVH noted on ECG.     Follow up: PRN we will follow up with the results of testing.  Medication Adjustments/Labs and Tests Ordered: Current medicines are reviewed at length with the patient today.  Concerns regarding medicines are outlined above.  Orders Placed This Encounter  Procedures   CT CORONARY MORPH W/CTA COR W/SCORE W/CA W/CM &/OR WO/CM   No orders of the defined types were placed in this encounter.   Patient Instructions  Medication Instructions:  The current medical regimen is effective;  continue present plan and medications.  *If you need a refill on your cardiac medications before your next appointment, please call your pharmacy*  Testing/Procedures:   Your cardiac CT will be scheduled at:   [facility] [address] [place], [place] [address] [phone]  Please arrive at the [facility] and Children's Entrance (Entrance C2) of [facility] 30 minutes prior to test start time. You can use the  FREE valet parking offered at entrance C (encouraged to control the heart rate for the test)  Proceed to the [facility] Radiology Department (first floor) to check-in and test prep.  All radiology patients and guests should use entrance C2 at [facility], accessed from [facility], even though the hospital's physical address listed is [address]    Please follow these instructions carefully (unless otherwise directed):  Hold all erectile dysfunction medications at least 3 days (72 hrs) prior to test. (Ie viagra, cialis, sildenafil, tadalafil, etc) We will administer nitroglycerin during this exam.   On the Night Before the Test: Be sure to Drink plenty of water. Do not consume any caffeinated/decaffeinated beverages or chocolate 12 hours prior to your test. Do not take any antihistamines 12 hours prior to your test.  On the Day of the Test: Drink plenty of water until 1 hour prior to the test. Do not eat any food 1 hour prior to test. You may take your regular medications prior to the test.  Take metoprolol (Lopressor) two hours prior to test. HOLD Furosemide/Hydrochlorothiazide morning of the test.      After the Test: Drink plenty of water. After receiving IV contrast, you may experience a mild flushed feeling. This is normal. On occasion, you may experience a mild rash up to 24 hours after the test. This is not dangerous. If this occurs, you can take Benadryl 25 mg and increase your fluid intake. If you experience trouble breathing, this can be serious. If it is severe call 911 IMMEDIATELY. If it is mild, please call our office.  We will call to schedule your test 2-4 weeks out understanding that some insurance companies will need an authorization prior to the service being performed.   For non-scheduling related questions, please contact the cardiac imaging nurse navigator should you have any questions/concerns: Rockwell Alexandria, Cardiac Imaging Nurse  Navigator Larey Brick, Cardiac Imaging Nurse Navigator [place] Direct Office Dial: [phone]   For scheduling needs, including cancellations and rescheduling, please call [place], [phone].  Follow-Up: At [facility], you and your health needs are our priority.  As part of our continuing mission to provide you with exceptional heart care, we have created designated Provider Care Teams.  These Care Teams include your primary Cardiologist (physician) and Advanced Practice Providers (APPs -  Physician Assistants and Nurse Practitioners) who all work together to provide you with the care you need, when you need it.  We recommend signing up for the patient portal called "MyChart".  Sign up information is provided on this After Visit Summary.  MyChart is used to connect with patients for Virtual Visits (Telemedicine).  Patients are able to view lab/test results, encounter notes, upcoming appointments, etc.  Non-urgent messages can be sent to your provider as well.   To learn more about what you can do with MyChart, go to [email or web address].    Your next appointment:   Follow up will be based on the results of the above testing.   Important Information About Sugar         I,Coren O'Brien,acting as a scribe for Donato Schultz, MD.,have documented all relevant documentation on the behalf of Donato Schultz, MD,as directed by  Donato Schultz, MD while in the presence of Donato Schultz, MD.  I, Donato Schultz, MD, have reviewed all documentation for this visit. The documentation on 01/26/22 for the exam, diagnosis, procedures, and orders are all accurate and complete.   Signed, Donato Schultz, MD  01/26/2022 4:59 PM    [facility]

## 2022-02-08 ENCOUNTER — Telehealth (HOSPITAL_COMMUNITY): Payer: Self-pay | Admitting: Emergency Medicine

## 2022-02-08 NOTE — Telephone Encounter (Signed)
Attempted to call patient regarding upcoming cardiac CT appointment. °Left message on voicemail with name and callback number °Nakeem Murnane RN Navigator Cardiac Imaging °[facility] °[phone] Office °[phone] Cell ° °

## 2022-02-10 ENCOUNTER — Ambulatory Visit (HOSPITAL_COMMUNITY)
Admission: RE | Admit: 2022-02-10 | Discharge: 2022-02-10 | Disposition: A | Discharge status: Home / Self Care | Payer: PRIVATE HEALTH INSURANCE | Source: Ambulatory Visit | Attending: Cardiology | Admitting: Cardiology

## 2022-02-10 DIAGNOSIS — R072 Precordial pain: Secondary | ICD-10-CM | POA: Insufficient documentation

## 2022-02-10 MED ORDER — NITROGLYCERIN 0.4 MG SL SUBL
0.8000 mg | SUBLINGUAL_TABLET | Freq: Once | SUBLINGUAL | Status: AC
  Administered 2022-02-10: 0.8 mg via SUBLINGUAL

## 2022-02-10 MED ORDER — IOHEXOL 350 MG/ML SOLN
100.0000 mL | Freq: Once | INTRAVENOUS | Status: AC | PRN
  Administered 2022-02-10: 100 mL via INTRAVENOUS

## 2022-02-10 MED ORDER — NITROGLYCERIN 0.4 MG SL SUBL
SUBLINGUAL_TABLET | SUBLINGUAL | Status: AC
  Filled 2022-02-10: qty 2

## 2022-02-15 ENCOUNTER — Telehealth: Payer: Self-pay | Admitting: Cardiology

## 2022-02-15 NOTE — Telephone Encounter (Signed)
Called pt reviewed results of Cardiac CT.  Pt had no questions or concerns.

## 2022-02-15 NOTE — Telephone Encounter (Signed)
Pt is returning call for results. Requesting call back.  

## 2022-04-04 ENCOUNTER — Telehealth: Payer: Self-pay | Admitting: Orthopedic Surgery

## 2022-04-04 NOTE — Telephone Encounter (Signed)
Patient came in wondering if her could request all CT's MRI's and Xray done for him even ones not ordered by [place] since 12/2020 until Current. Please advise on what we can and cannot get put on a CD

## 2022-04-04 NOTE — Telephone Encounter (Signed)
Can you please see this message below and let the pt know what we are able to put on disc for him please?

## 2022-04-04 NOTE — Telephone Encounter (Signed)
Called and advised patient that we are only able to copy imaging done here in our office, he would need to get all MRI's and CT scans from the facility where he had them done. CD of xrays placed at front desk for pickup.

## 2022-05-11 ENCOUNTER — Ambulatory Visit (INDEPENDENT_AMBULATORY_CARE_PROVIDER_SITE_OTHER): Payer: No Typology Code available for payment source | Admitting: Orthopedic Surgery

## 2022-05-11 ENCOUNTER — Encounter: Payer: Self-pay | Admitting: Orthopedic Surgery

## 2022-05-11 DIAGNOSIS — M79672 Pain in left foot: Secondary | ICD-10-CM | POA: Diagnosis not present

## 2022-05-11 DIAGNOSIS — S93622A Sprain of tarsometatarsal ligament of left foot, initial encounter: Secondary | ICD-10-CM

## 2022-05-11 NOTE — Progress Notes (Addendum)
Office Visit Note   Patient: Steven Herring           Date of Birth: Aug 11, 1980           MRN: [id number] Visit Date: 05/11/2022              Requested by: No referring provider defined for this encounter. PCP: Pcp, No  Chief Complaint  Patient presents with   Left Foot - Follow-up    Review second opinion      HPI: Patient is a 42 year old gentleman who is seen for Workmen's Compensation second opinion regarding further intervention for his left foot.  Patient initially sustained a Lisfranc injury and underwent open reduction internal fixation.  Patient has had persistent pain in his left foot he describes pain across the forefoot painful clawing of the toes and pain across the midfoot as well.  Patient states he has global left foot pain.  He states he is unable to perform activities of daily living due to the left foot pain.  Assessment & Plan: Visit Diagnoses:  1. Pain in left foot   2. Lisfranc's sprain, left, initial encounter     Plan: Discussed that if patient cannot perform activities of daily living it would be reasonable  to proceed with removal of deep retained hardware as well as bunion and claw toe surgery for the left foot.  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  Follow-Up Instructions: No follow-ups on file.   Ortho Exam  Patient is alert, oriented, no adenopathy, well-dressed, normal affect, normal respiratory effort. Examination of the left foot patient has palpable pulses.  He has pain across the forefoot with attempted distraction through the midfoot and pain to palpation across the metatarsal heads as well as pain to palpation over the clawed toes.  Distraction of the midfoot did not elicit pain.  Patient does have pain to palpation directly over the midfoot.  Patient complains of pain across the metatarsal  heads with weightbearing.  He has a good stiff pair of Kimberlyn shoes to protect his midfoot.  He has fixed clawing of the toes on the left foot greater than the clawing of the toes of the right foot.  Patient also has a larger bunion on the left foot which overlaps the second toe which is worse than the right foot.  Examination of the right lower extremity patient has large varicose veins that extends from the proximal calf to the distal thigh over the popliteal fossa.  Hallux valgus angle 40 degrees and intermetatarsal angle of 15 degrees.  There are no dystrophic changes no pain to light touch there is good wrinkling of the skin without swelling there is no cellulitis.  Patient has a normal gait.  Imaging: No results found. No images are attached to the encounter.  Labs: No results found for: "HGBA1C", "ESRSEDRATE", "CRP", "LABURIC", "REPTSTATUS", "GRAMSTAIN", "CULT", "LABORGA"   No results found for: "ALBUMIN", "PREALBUMIN", "CBC"  No results found for: "MG" No results found for: "VD25OH"  No results found for: "PREALBUMIN"    Latest Ref Rng & Units 12/22/2021    6:20 PM 05/20/2021    5:49 AM  CBC EXTENDED  WBC 4.0 - 10.5 K/uL 5.2  5.5   RBC 4.22 - 5.81 MIL/uL 5.27  5.48   Hemoglobin 13.0 - 17.0 g/dL 14.8  15.5   HCT 39.0 -  52.0 % 45.5  47.9   Platelets 150 - 400 K/uL 232  263      There is no height or weight on file to calculate BMI.  Orders:  No orders of the defined types were placed in this encounter.  No orders of the defined types were placed in this encounter.    Procedures: No procedures performed  Clinical Data: No additional findings.  ROS:  All other systems negative, except as noted in the HPI. Review of Systems  Objective: Vital Signs: There were no vitals taken for this visit.  Specialty Comments:  No specialty comments available.  PMFS History: Patient Active Problem List   Diagnosis Date Noted   Lisfranc dislocation, left, initial  encounter    Contusion of bone 12/29/2020   History reviewed. No pertinent past medical history.  History reviewed. No pertinent family history.  Past Surgical History:  Procedure Laterality Date   FOOT ARTHRODESIS Left 05/20/2021   Procedure: LEFT MIDFOOT FUSION;  Surgeon: Newt Minion, MD;  Location: [facility];  Service: Orthopedics;  Laterality: Left;   Social History   Occupational History   Not on file  Tobacco Use   Smoking status: Every Day    Packs/day: 1.00    Types: Cigarettes   Smokeless tobacco: Never  Vaping Use   Vaping Use: Never used  Substance and Sexual Activity   Alcohol use: Not Currently   Drug use: Not Currently   Sexual activity: Yes

## 2022-06-20 ENCOUNTER — Telehealth: Payer: Self-pay | Admitting: Orthopedic Surgery

## 2022-06-20 NOTE — Telephone Encounter (Signed)
I called. Left message on voicemail for Steven Herring to please return my call to discuss scheduling surgery.

## 2022-07-18 ENCOUNTER — Encounter: Payer: Self-pay | Admitting: *Deleted

## 2022-08-23 NOTE — Progress Notes (Signed)
Patient was called multiple times with questions and instructions for the surgery day. Patient is not available and there is no possibility to leave a message on his phone. Patient's mother was called with no success.

## 2022-08-24 ENCOUNTER — Encounter (HOSPITAL_COMMUNITY): Payer: Self-pay | Admitting: Anesthesiology

## 2022-08-24 NOTE — Progress Notes (Addendum)
Steven Herring has been called several times Wednesday and today. I have called patient and his mother, I asked mother the have patient to call me. I did not receive a return call. I called Steven Herring and left the followinmg instruction on Steven Herring, [name]' smother and designated party release. Steven Herring should arrive at 0530, at the main entrance, Entrance A.  Nothing to eat or drink after midnight, do not take any medications, we were unable to  reach him to find out his pre-admission medications. Shower in am with antibiotic soap, if you do not have any use your regular soap, rinse and dry off with a clean towel. DO not put any lotions, powders, cologne or deodorant, jewelry, piercing's or nail polish.  If you were glasses, hearing aids, dentures or partials, you will not be able to wear those in OR. If you have a glasses case, bring it with you, dentures, partials should be clean and able to be removed from the mouth, th hospital will have a denture cup.  The hospital will take care of belongings, but Steven Herring is not responsible your your belongings.  You will need someone to drive you home and stay with you for the first 24 hours after surgery.  No smoking or vaping from now until after surgery. I left Pr- OP desk desk phone number for any questions or problems or if he has s/s of Covid or a respiratory infection or if yo have been exposed to anyone with s/s of Covid.

## 2022-08-24 NOTE — Anesthesia Preprocedure Evaluation (Deleted)
Anesthesia Evaluation    Airway        Dental   Pulmonary neg pulmonary ROS, Current Smoker          Cardiovascular negative cardio ROS      Neuro/Psych negative neurological ROS  negative psych ROS   GI/Hepatic negative GI ROS, Neg liver ROS,,,  Endo/Other  negative endocrine ROS    Renal/GU negative Renal ROS  negative genitourinary   Musculoskeletal  (+) Arthritis , Osteoarthritis,    Abdominal   Peds  Hematology Lab Results      Component                Value               Date                      WBC                      5.2                 12/22/2021                HGB                      14.8                12/22/2021                HCT                      45.5                12/22/2021                MCV                      86.3                12/22/2021                PLT                      232                 12/22/2021             Lab Results      Component                Value               Date                      NA                       137                 12/22/2021                K                        3.9                 12/22/2021                CO2  24                  12/22/2021                GLUCOSE                  99                  12/22/2021                BUN                      14                  12/22/2021                CREATININE               1.29 (H)            12/22/2021                CALCIUM                  8.9                 12/22/2021                GFRNONAA                 >60                 12/22/2021              Anesthesia Other Findings   Reproductive/Obstetrics                             Anesthesia Physical Anesthesia Plan  ASA: 2  Anesthesia Plan: General and Regional   Post-op Pain Management: Regional block*   Induction: Intravenous  PONV Risk Score and Plan: 1 and Ondansetron, Dexamethasone,  Midazolam and Treatment may vary due to age or medical condition  Airway Management Planned: Mask and LMA  Additional Equipment: None  Intra-op Plan:   Post-operative Plan: Extubation in OR  Informed Consent:   Plan Discussed with:   Anesthesia Plan Comments:        Anesthesia Quick Evaluation

## 2022-08-25 ENCOUNTER — Ambulatory Visit (HOSPITAL_COMMUNITY)
Admission: RE | Admit: 2022-08-25 | Payer: No Typology Code available for payment source | Source: Home / Self Care | Admitting: Orthopedic Surgery

## 2022-08-25 SURGERY — FUSION, JOINT, FOOT
Anesthesia: Choice | Site: Foot | Laterality: Left

## 2022-09-06 ENCOUNTER — Other Ambulatory Visit: Payer: Self-pay

## 2022-09-06 ENCOUNTER — Encounter (HOSPITAL_COMMUNITY): Payer: Self-pay | Admitting: Orthopedic Surgery

## 2022-09-06 NOTE — Progress Notes (Signed)
SDW call  Patient was given pre-op instructions over the phone. Patient verbalized understanding of instructions provided.    PCP - [facility] Cardiologist - Dr. Anne Fu Pulmonary:    PPM/ICD - denies   Chest x-ray - 12/22/2021 EKG -  12/26/2021 Stress Test - ECHO -  Cardiac Cath -   Sleep Study/sleep apnea/CPAP: denies  Non-diabetic   Blood Thinner Instructions: Denies Aspirin Instructions:Denies   ERAS Protcol - Yes, clear fluids until 0430 PRE-SURGERY Ensure or G2-    COVID TEST- n/a    Anesthesia review: No   Patient denies shortness of breath, fever, cough and chest pain over the phone call  Your procedure is scheduled on Friday September 08, 2022  Report to [facility] Main Entrance "A" at 0530   A.M., then check in with the Admitting office.  Call this number if you have problems the morning of surgery:  [phone]   If you have any questions prior to your surgery date call [phone]: Open Monday-Friday 8am-4pm If you experience any cold or flu symptoms such as cough, fever, chills, shortness of breath, etc. between now and your scheduled surgery, please notify us at the above number    Remember:  Do not eat after midnight the night before your surgery  You may drink clear liquids until  0430   the morning of your surgery.   Clear liquids allowed are: Water, Non-Citrus Juices (without pulp), Carbonated Beverages, Clear Tea, Black Coffee ONLY (NO MILK, CREAM OR POWDERED CREAMER of any kind), and Gatorade   Take these medicines as needed the morning of surgery with A SIP OF WATER:  Percocet  As of today, STOP taking any Aspirin (unless otherwise instructed by your surgeon) Aleve, Naproxen, Ibuprofen, Motrin, Advil, Goody's, BC's, all herbal medications, fish oil, and all vitamins.

## 2022-09-07 NOTE — Anesthesia Preprocedure Evaluation (Addendum)
Anesthesia Evaluation  Patient identified by MRN, date of birth, ID band Patient awake    Reviewed: Allergy & Precautions, NPO status , Patient's Chart, lab work & pertinent test results  History of Anesthesia Complications Negative for: history of anesthetic complications  Airway Mallampati: III  TM Distance: >3 FB Neck ROM: Full    Dental  (+) Dental Advisory Given   Pulmonary neg shortness of breath, neg sleep apnea, neg COPD, neg recent URI, Current Smoker   Pulmonary exam normal breath sounds clear to auscultation       Cardiovascular negative cardio ROS  Rhythm:Regular Rate:Normal     Neuro/Psych negative neurological ROS     GI/Hepatic Neg liver ROS,neg GERD  ,,Patient has been having diarrhea for the past 2 days. Reports it's a little better today. No other symptoms. Afebrile. VSS.   Endo/Other  negative endocrine ROS    Renal/GU negative Renal ROS     Musculoskeletal   Abdominal   Peds  Hematology negative hematology ROS (+)   Anesthesia Other Findings   Reproductive/Obstetrics                             Anesthesia Physical Anesthesia Plan  ASA: 2  Anesthesia Plan: MAC and Regional   Post-op Pain Management: Regional block* and Tylenol PO (pre-op)*   Induction: Intravenous  PONV Risk Score and Plan: 1 and Ondansetron, Dexamethasone, Propofol infusion and Treatment may vary due to age or medical condition  Airway Management Planned: Natural Airway and Simple Face Mask  Additional Equipment:   Intra-op Plan:   Post-operative Plan:   Informed Consent: I have reviewed the patients History and Physical, chart, labs and discussed the procedure including the risks, benefits and alternatives for the proposed anesthesia with the patient or authorized representative who has indicated his/her understanding and acceptance.     Dental advisory given  Plan Discussed with:  Anesthesiologist and CRNA  Anesthesia Plan Comments: (Discussed potential risks of nerve blocks including, but not limited to, infection, bleeding, nerve damage, seizures, pneumothorax, respiratory depression, and potential failure of the block. Alternatives to nerve blocks discussed. All questions answered.  Discussed with patient risks of MAC including, but not limited to, minor pain or discomfort, hearing people in the room, and possible need for backup general anesthesia. Risks for general anesthesia also discussed including, but not limited to, sore throat, hoarse voice, chipped/damaged teeth, injury to vocal cords, nausea and vomiting, allergic reactions, lung infection, heart attack, stroke, and death. All questions answered. )       Anesthesia Quick Evaluation

## 2022-09-08 ENCOUNTER — Ambulatory Visit (HOSPITAL_COMMUNITY): Payer: No Typology Code available for payment source | Admitting: General Practice

## 2022-09-08 ENCOUNTER — Ambulatory Visit (HOSPITAL_COMMUNITY)
Admission: RE | Admit: 2022-09-08 | Discharge: 2022-09-08 | Disposition: A | Discharge status: Home / Self Care | Payer: No Typology Code available for payment source | Attending: Orthopedic Surgery | Admitting: Orthopedic Surgery

## 2022-09-08 ENCOUNTER — Encounter (HOSPITAL_COMMUNITY)
Admission: RE | Disposition: A | Discharge status: Home / Self Care | Payer: Self-pay | Source: Home / Self Care | Attending: Orthopedic Surgery

## 2022-09-08 ENCOUNTER — Ambulatory Visit (HOSPITAL_COMMUNITY): Payer: No Typology Code available for payment source

## 2022-09-08 ENCOUNTER — Encounter (HOSPITAL_COMMUNITY): Payer: Self-pay | Admitting: Orthopedic Surgery

## 2022-09-08 ENCOUNTER — Telehealth: Payer: Self-pay | Admitting: Radiology

## 2022-09-08 ENCOUNTER — Other Ambulatory Visit: Payer: Self-pay

## 2022-09-08 ENCOUNTER — Ambulatory Visit (HOSPITAL_BASED_OUTPATIENT_CLINIC_OR_DEPARTMENT_OTHER): Payer: No Typology Code available for payment source | Admitting: General Practice

## 2022-09-08 DIAGNOSIS — M12572 Traumatic arthropathy, left ankle and foot: Secondary | ICD-10-CM | POA: Insufficient documentation

## 2022-09-08 DIAGNOSIS — Q6689 Other  specified congenital deformities of feet: Secondary | ICD-10-CM | POA: Diagnosis present

## 2022-09-08 DIAGNOSIS — M205X2 Other deformities of toe(s) (acquired), left foot: Secondary | ICD-10-CM | POA: Diagnosis not present

## 2022-09-08 DIAGNOSIS — M2012 Hallux valgus (acquired), left foot: Secondary | ICD-10-CM

## 2022-09-08 DIAGNOSIS — F17210 Nicotine dependence, cigarettes, uncomplicated: Secondary | ICD-10-CM | POA: Insufficient documentation

## 2022-09-08 DIAGNOSIS — F17200 Nicotine dependence, unspecified, uncomplicated: Secondary | ICD-10-CM | POA: Diagnosis not present

## 2022-09-08 DIAGNOSIS — S93325A Dislocation of tarsometatarsal joint of left foot, initial encounter: Secondary | ICD-10-CM | POA: Diagnosis not present

## 2022-09-08 DIAGNOSIS — M21612 Bunion of left foot: Secondary | ICD-10-CM | POA: Diagnosis not present

## 2022-09-08 DIAGNOSIS — Z01818 Encounter for other preprocedural examination: Secondary | ICD-10-CM

## 2022-09-08 HISTORY — PX: FOOT ARTHRODESIS: SHX1655

## 2022-09-08 LAB — CBC
HCT: 42.2 % (ref 39.0–52.0)
Hemoglobin: 13.5 g/dL (ref 13.0–17.0)
MCH: 27.6 pg (ref 26.0–34.0)
MCHC: 32 g/dL (ref 30.0–36.0)
MCV: 86.3 fL (ref 80.0–100.0)
Platelets: 270 10*3/uL (ref 150–400)
RBC: 4.89 MIL/uL (ref 4.22–5.81)
RDW: 14.1 % (ref 11.5–15.5)
WBC: 4.8 10*3/uL (ref 4.0–10.5)
nRBC: 0 % (ref 0.0–0.2)

## 2022-09-08 LAB — BASIC METABOLIC PANEL
Anion gap: 10 (ref 5–15)
BUN: 12 mg/dL (ref 6–20)
CO2: 24 mmol/L (ref 22–32)
Calcium: 8.6 mg/dL — ABNORMAL LOW (ref 8.9–10.3)
Chloride: 101 mmol/L (ref 98–111)
Creatinine, Ser: 1.44 mg/dL — ABNORMAL HIGH (ref 0.61–1.24)
GFR, Estimated: >60 mL/min (ref >60)
Glucose, Bld: 97 mg/dL (ref 70–99)
Potassium: 3.7 mmol/L (ref 3.5–5.1)
Sodium: 135 mmol/L (ref 135–145)

## 2022-09-08 SURGERY — FUSION, JOINT, FOOT
Anesthesia: Monitor Anesthesia Care | Site: Foot | Laterality: Left

## 2022-09-08 MED ORDER — ONDANSETRON HCL 4 MG/2ML IJ SOLN
INTRAMUSCULAR | Status: DC | PRN
  Administered 2022-09-08: 4 mg via INTRAVENOUS

## 2022-09-08 MED ORDER — MIDAZOLAM HCL 2 MG/2ML IJ SOLN
INTRAMUSCULAR | Status: DC | PRN
  Administered 2022-09-08: 2 mg via INTRAVENOUS

## 2022-09-08 MED ORDER — LIDOCAINE 2% (20 MG/ML) 5 ML SYRINGE
INTRAMUSCULAR | Status: DC | PRN
  Administered 2022-09-08: 40 mg via INTRAVENOUS

## 2022-09-08 MED ORDER — ACETAMINOPHEN 500 MG PO TABS
1000.0000 mg | ORAL_TABLET | Freq: Once | ORAL | Status: AC
  Administered 2022-09-08: 1000 mg via ORAL

## 2022-09-08 MED ORDER — PHENYLEPHRINE 80 MCG/ML (10ML) SYRINGE FOR IV PUSH (FOR BLOOD PRESSURE SUPPORT)
PREFILLED_SYRINGE | INTRAVENOUS | Status: AC
  Filled 2022-09-08: qty 10

## 2022-09-08 MED ORDER — ONDANSETRON HCL 4 MG/2ML IJ SOLN
INTRAMUSCULAR | Status: AC
  Filled 2022-09-08: qty 2

## 2022-09-08 MED ORDER — CEFAZOLIN SODIUM-DEXTROSE 2-4 GM/100ML-% IV SOLN
INTRAVENOUS | Status: AC
  Filled 2022-09-08: qty 100

## 2022-09-08 MED ORDER — PROPOFOL 10 MG/ML IV BOLUS
INTRAVENOUS | Status: DC | PRN
  Administered 2022-09-08: 30 mg via INTRAVENOUS

## 2022-09-08 MED ORDER — 0.9 % SODIUM CHLORIDE (POUR BTL) OPTIME
TOPICAL | Status: DC | PRN
  Administered 2022-09-08: 1000 mL

## 2022-09-08 MED ORDER — FENTANYL CITRATE (PF) 100 MCG/2ML IJ SOLN: 25.0000 ug | INTRAMUSCULAR | Status: DC | PRN

## 2022-09-08 MED ORDER — AMISULPRIDE (ANTIEMETIC) 5 MG/2ML IV SOLN: 10.0000 mg | Freq: Once | INTRAVENOUS | Status: DC | PRN

## 2022-09-08 MED ORDER — CHLORHEXIDINE GLUCONATE 0.12 % MT SOLN: 15.0000 mL | Freq: Once | OROMUCOSAL | Status: AC

## 2022-09-08 MED ORDER — OXYCODONE HCL 5 MG PO TABS: 5.0000 mg | ORAL_TABLET | Freq: Once | ORAL | Status: DC | PRN

## 2022-09-08 MED ORDER — PHENYLEPHRINE 80 MCG/ML (10ML) SYRINGE FOR IV PUSH (FOR BLOOD PRESSURE SUPPORT)
PREFILLED_SYRINGE | INTRAVENOUS | Status: DC | PRN
  Administered 2022-09-08 (×3): 80 ug via INTRAVENOUS

## 2022-09-08 MED ORDER — FENTANYL CITRATE (PF) 250 MCG/5ML IJ SOLN
INTRAMUSCULAR | Status: AC
  Filled 2022-09-08: qty 5

## 2022-09-08 MED ORDER — FENTANYL CITRATE (PF) 250 MCG/5ML IJ SOLN
INTRAMUSCULAR | Status: DC | PRN
  Administered 2022-09-08: 50 ug via INTRAVENOUS
  Administered 2022-09-08: 100 ug via INTRAVENOUS

## 2022-09-08 MED ORDER — PROPOFOL 500 MG/50ML IV EMUL
INTRAVENOUS | Status: DC | PRN
  Administered 2022-09-08: 150 ug/kg/min via INTRAVENOUS

## 2022-09-08 MED ORDER — PROPOFOL 10 MG/ML IV BOLUS
INTRAVENOUS | Status: AC
  Filled 2022-09-08: qty 20

## 2022-09-08 MED ORDER — ORAL CARE MOUTH RINSE: 15.0000 mL | Freq: Once | OROMUCOSAL | Status: AC

## 2022-09-08 MED ORDER — CEFAZOLIN SODIUM-DEXTROSE 2-4 GM/100ML-% IV SOLN
2.0000 g | INTRAVENOUS | Status: AC
  Administered 2022-09-08: 2 g via INTRAVENOUS

## 2022-09-08 MED ORDER — PHENYLEPHRINE HCL-NACL 20-0.9 MG/250ML-% IV SOLN
INTRAVENOUS | Status: DC | PRN
  Administered 2022-09-08: 10 ug/min via INTRAVENOUS

## 2022-09-08 MED ORDER — CHLORHEXIDINE GLUCONATE 0.12 % MT SOLN
OROMUCOSAL | Status: AC
  Filled 2022-09-08: qty 15

## 2022-09-08 MED ORDER — ROPIVACAINE HCL 5 MG/ML IJ SOLN
INTRAMUSCULAR | Status: DC | PRN
  Administered 2022-09-08: 25 mL via PERINEURAL
  Administered 2022-09-08: 15 mL via PERINEURAL

## 2022-09-08 MED ORDER — OXYCODONE HCL 5 MG/5ML PO SOLN: 5.0000 mg | Freq: Once | ORAL | Status: DC | PRN

## 2022-09-08 MED ORDER — MIDAZOLAM HCL 2 MG/2ML IJ SOLN
INTRAMUSCULAR | Status: AC
  Filled 2022-09-08: qty 2

## 2022-09-08 MED ORDER — LACTATED RINGERS IV SOLN: INTRAVENOUS | Status: DC

## 2022-09-08 MED ORDER — OXYCODONE-ACETAMINOPHEN 5-325 MG PO TABS: 1.0000 | ORAL_TABLET | ORAL | 0 refills | Status: DC | PRN

## 2022-09-08 MED ORDER — LIDOCAINE 2% (20 MG/ML) 5 ML SYRINGE
INTRAMUSCULAR | Status: AC
  Filled 2022-09-08: qty 5

## 2022-09-08 SURGICAL SUPPLY — 56 items
ASMB FX 20X17 STPL INSRTR (Staple) ×2 IMPLANT
BAG COUNTER SPONGE SURGICOUNT (BAG) ×1 IMPLANT
BAG SPNG CNTER NS LX DISP (BAG) ×1
BANDAGE ESMARK 6X9 LF (GAUZE/BANDAGES/DRESSINGS) IMPLANT
BIT DRILL LONG 1.5 ZI (BIT) IMPLANT
BLADE SAW SGTL HD 18.5X60.5X1. (BLADE) ×1 IMPLANT
BLADE SAW SGTL NAR THIN XSHT (BLADE) IMPLANT
BLADE SURG 10 STRL SS (BLADE) IMPLANT
BNDG CMPR 5X6 CHSV STRCH STRL (GAUZE/BANDAGES/DRESSINGS) ×1
BNDG CMPR 9X6 STRL LF SNTH (GAUZE/BANDAGES/DRESSINGS)
BNDG COHESIVE 4X5 TAN STRL (GAUZE/BANDAGES/DRESSINGS) ×1 IMPLANT
BNDG COHESIVE 6X5 TAN ST LF (GAUZE/BANDAGES/DRESSINGS) IMPLANT
BNDG ESMARK 6X9 LF (GAUZE/BANDAGES/DRESSINGS)
BNDG GAUZE DERMACEA FLUFF 4 (GAUZE/BANDAGES/DRESSINGS) ×2 IMPLANT
BNDG GZE DERMACEA 4 6PLY (GAUZE/BANDAGES/DRESSINGS) ×1
CAP PIN ORTHO PINK (CAP) IMPLANT
COTTON STERILE ROLL (GAUZE/BANDAGES/DRESSINGS) ×1 IMPLANT
COVER MAYO STAND STRL (DRAPES) IMPLANT
COVER SURGICAL LIGHT HANDLE (MISCELLANEOUS) ×2 IMPLANT
DRAPE INCISE IOBAN 66X45 STRL (DRAPES) ×1 IMPLANT
DRAPE OEC MINIVIEW 54X84 (DRAPES) IMPLANT
DRAPE U-SHAPE 47X51 STRL (DRAPES) ×1 IMPLANT
DRSG ADAPTIC 3X8 NADH LF (GAUZE/BANDAGES/DRESSINGS) ×1 IMPLANT
DURAPREP 26ML APPLICATOR (WOUND CARE) ×1 IMPLANT
ELECT REM PT RETURN 9FT ADLT (ELECTROSURGICAL) ×1
ELECTRODE REM PT RTRN 9FT ADLT (ELECTROSURGICAL) ×1 IMPLANT
GAUZE PAD ABD 8X10 STRL (GAUZE/BANDAGES/DRESSINGS) IMPLANT
GAUZE SPONGE 4X4 12PLY STRL (GAUZE/BANDAGES/DRESSINGS) ×1 IMPLANT
GLOVE BIOGEL PI IND STRL 9 (GLOVE) ×1 IMPLANT
GLOVE SURG ORTHO 9.0 STRL STRW (GLOVE) ×1 IMPLANT
GOWN STRL REUS W/ TWL XL LVL3 (GOWN DISPOSABLE) ×3 IMPLANT
GOWN STRL REUS W/TWL XL LVL3 (GOWN DISPOSABLE) ×3
K-WIRE ALPS MXV 1.6X6 ZI (WIRE) ×2
KIT BASIN OR (CUSTOM PROCEDURE TRAY) ×1 IMPLANT
KIT STAPLE ARCUS 20X17 STRL (Staple) IMPLANT
KIT TURNOVER KIT B (KITS) ×1 IMPLANT
KWIRE ALPS MXV 1.6X6 ZI (WIRE) IMPLANT
MANIFOLD NEPTUNE II (INSTRUMENTS) ×1 IMPLANT
NS IRRIG 1000ML POUR BTL (IV SOLUTION) ×1 IMPLANT
PACK ORTHO EXTREMITY (CUSTOM PROCEDURE TRAY) ×1 IMPLANT
PAD ARMBOARD 7.5X6 YLW CONV (MISCELLANEOUS) ×2 IMPLANT
PAD CAST 4YDX4 CTTN HI CHSV (CAST SUPPLIES) ×1 IMPLANT
PAD CAST CTTN 4X4 STRL (SOFTGOODS) IMPLANT
PADDING CAST COTTON 4X4 STRL (CAST SUPPLIES)
PADDING CAST COTTON 4X4 STRL (SOFTGOODS) ×1
PUTTY DBM STAGRAFT PLUS 2CC (Putty) IMPLANT
SCREW NLOCK 2X12 (Screw) IMPLANT
SPONGE T-LAP 18X18 ~~LOC~~+RFID (SPONGE) ×1 IMPLANT
STAPLE ASSEMBLY ARCUS 20X17 (Staple) IMPLANT
SUCTION TUBE FRAZIER 10FR DISP (SUCTIONS) ×1 IMPLANT
SUT ETHILON 2 0 PSLX (SUTURE) ×3 IMPLANT
SUT VIC AB 2-0 CT1 (SUTURE) IMPLANT
TOWEL GREEN STERILE (TOWEL DISPOSABLE) ×1 IMPLANT
TOWEL GREEN STERILE FF (TOWEL DISPOSABLE) ×1 IMPLANT
TUBE CONNECTING 12X1/4 (SUCTIONS) ×1 IMPLANT
WATER STERILE IRR 1000ML POUR (IV SOLUTION) ×1 IMPLANT

## 2022-09-08 NOTE — Anesthesia Procedure Notes (Signed)
Anesthesia Regional Block: Adductor canal block   Pre-Anesthetic Checklist: , timeout performed,  Correct Patient, Correct Site, Correct Laterality,  Correct Procedure, Correct Position, site marked,  Risks and benefits discussed,  Surgical consent,  Pre-op evaluation,  At surgeon's request and post-op pain management  Laterality: Left  Prep: chloraprep       Needles:  Injection technique: Single-shot  Needle Type: Echogenic Stimulator Needle     Needle Length: 9cm  Needle Gauge: 21     Additional Needles:   Procedures:,,,, ultrasound used (permanent image in chart),,    Narrative:  Start time: 09/08/2022 6:59 AM End time: 09/08/2022 7:01 AM Injection made incrementally with aspirations every 5 mL.  Performed by: Personally  Anesthesiologist: Linton Rump, MD  Additional Notes: Discussed risks and benefits of nerve block including, but not limited to, prolonged and/or permanent nerve injury involving sensory and/or motor function. Monitors were applied and a time-out was performed. The nerve and associated structures were visualized under ultrasound guidance. After negative aspiration, local anesthetic was slowly injected around the nerve. There was no evidence of high pressure during the procedure. There were no paresthesias. VSS remained stable and the patient tolerated the procedure well.

## 2022-09-08 NOTE — Progress Notes (Signed)
Orthopedic Tech Progress Note Patient Details:  Steven Herring 01/14/1981 [id number]  Ortho Devices Type of Ortho Device: Crutches Ortho Device/Splint Interventions: Ordered, Adjustment  Delivered crutches to PACU    [name] [name] 09/08/2022, 9:35 AM

## 2022-09-08 NOTE — Anesthesia Procedure Notes (Signed)
Procedure Name: MAC Date/Time: 09/08/2022 7:30 AM  Performed by: Maxine Glenn, CRNAPre-anesthesia Checklist: Patient identified, Emergency Drugs available, Suction available and Patient being monitored Patient Re-evaluated:Patient Re-evaluated prior to induction Oxygen Delivery Method: Simple face mask

## 2022-09-08 NOTE — Anesthesia Procedure Notes (Signed)
Anesthesia Regional Block: Popliteal block   Pre-Anesthetic Checklist: , timeout performed,  Correct Patient, Correct Site, Correct Laterality,  Correct Procedure, Correct Position, site marked,  Risks and benefits discussed,  Surgical consent,  Pre-op evaluation,  At surgeon's request and post-op pain management  Laterality: Left  Prep: chloraprep       Needles:  Injection technique: Single-shot  Needle Type: Echogenic Stimulator Needle     Needle Length: 9cm  Needle Gauge: 21     Additional Needles:   Procedures:,,,, ultrasound used (permanent image in chart),,    Narrative:  Start time: 09/08/2022 6:54 AM End time: 09/08/2022 6:58 AM Injection made incrementally with aspirations every 5 mL.  Performed by: Personally  Anesthesiologist: Linton Rump, MD  Additional Notes: Discussed risks and benefits of nerve block including, but not limited to, prolonged and/or permanent nerve injury involving sensory and/or motor function. Monitors were applied and a time-out was performed. The nerve and associated structures were visualized under ultrasound guidance. After negative aspiration, local anesthetic was slowly injected around the nerve. There was no evidence of high pressure during the procedure. There were no paresthesias. VSS remained stable and the patient tolerated the procedure well.

## 2022-09-08 NOTE — H&P (Addendum)
Steven Herring is an 42 y.o. male.   Chief Complaint: Left foot pain. HPI: Patient initially sustained a Lisfranc injury and underwent open reduction internal fixation. Patient has had persistent pain in his left foot he describes pain across the forefoot painful clawing of the toes and pain across the midfoot as well. Patient states he has global left foot pain. He states he is unable to perform activities of daily living due to the left foot pain.   History reviewed. No pertinent past medical history.  Past Surgical History:  Procedure Laterality Date   FOOT ARTHRODESIS Left 05/20/2021   Procedure: LEFT MIDFOOT FUSION;  Surgeon: Nadara Mustard, MD;  Location: [facility] OR;  Service: Orthopedics;  Laterality: Left;    History reviewed. No pertinent family history. Social History:  reports that he has been smoking cigarettes. He has been smoking an average of 1 pack per day. He has never used smokeless tobacco. He reports that he does not currently use alcohol. He reports that he does not currently use drugs.  Allergies: No Known Allergies  Medications Prior to Admission  Medication Sig Dispense Refill   oxyCODONE-acetaminophen (PERCOCET/ROXICET) 5-325 MG tablet Take 1 tablet by mouth every 8 (eight) hours as needed. (Patient taking differently: Take 1 tablet by mouth every 8 (eight) hours as needed for severe pain.) 20 tablet 0    No results found for this or any previous visit (from the past 48 hour(s)). No results found.  Review of Systems  All other systems reviewed and are negative.   Blood pressure 130/81, pulse (!) 58, temperature 98.4 F (36.9 C), temperature source Oral, resp. rate 18, height 5' 7.99" (1.727 m), weight 95.3 kg, SpO2 98 %. Physical Exam  Patient is alert, oriented, no adenopathy, well-dressed, normal affect, normal respiratory effort. Examination of the left foot patient has palpable pulses.  He has pain across the forefoot with attempted distraction through the  midfoot and pain to palpation across the metatarsal heads as well as pain to palpation over the clawed toes.  Distraction of the midfoot did not elicit pain.  Patient does have pain to palpation directly over the midfoot.  Patient complains of pain across the metatarsal heads with weightbearing.  He has a good stiff pair of Kimberlyn shoes to protect his midfoot.  He has fixed clawing of the toes on the left foot greater than the clawing of the toes of the right foot.  Patient also has a larger bunion on the left foot which overlaps the second toe which is worse than the right foot.  Examination of the right lower extremity patient has large varicose veins that extends from the proximal calf to the distal thigh over the popliteal fossa. Assessment/Plan Persistent pain in left midfoot with bunion deformity and clawing of the toes.  Will plan for bunion surgery with chevron and Akin osteotomy as well as Weil osteotomy for the second metatarsal.  Will removal the deep retained hardware and fuse the midfoot.  Risk and benefits were discussed including recurrence of the deformity persistent pain need for additional surgery.  Patient states he understands wished to proceed at this time.  Nadara Mustard, MD 09/08/2022, 6:36 AM

## 2022-09-08 NOTE — Addendum Note (Signed)
Addendum  created 09/08/22 1218 by Linton Rump, MD   Clinical Note Signed

## 2022-09-08 NOTE — Anesthesia Postprocedure Evaluation (Addendum)
Anesthesia Post Note  Patient: Walton Digilio  Procedure(s) Performed: REMOVAL HARDWARE, FUSION LISFRANC JOINT, CHEVRON AND AIKEN OSTEOTOMY, WEIL OSTEOTOMY 2ND METATARSAL LEFT FOOT (Left: Foot)     Patient location during evaluation: PACU Anesthesia Type: Regional and MAC Level of consciousness: awake Pain management: pain level controlled Vital Signs Assessment: post-procedure vital signs reviewed and stable Respiratory status: spontaneous breathing, nonlabored ventilation and respiratory function stable Cardiovascular status: blood pressure returned to baseline and stable Postop Assessment: no apparent nausea or vomiting Anesthetic complications: no   No notable events documented.  Last Vitals:  Vitals:   09/08/22 0930 09/08/22 0940  BP: 123/85 114/70  Pulse: (!) 49 (!) 46  Resp: (!) 23 (!) 22  Temp:  37.1 C  SpO2: 98% 98%    Last Pain:  Vitals:   09/08/22 0940  TempSrc:   PainSc: 0-No pain                 Linton Rump

## 2022-09-08 NOTE — Telephone Encounter (Signed)
This pt had surgery today. Can you please see message below and advise if there is anything that we can do.

## 2022-09-08 NOTE — Transfer of Care (Signed)
Immediate Anesthesia Transfer of Care Note  Patient: Steven Herring  Procedure(s) Performed: REMOVAL HARDWARE, FUSION LISFRANC JOINT, CHEVRON AND AIKEN OSTEOTOMY, WEIL OSTEOTOMY 2ND METATARSAL LEFT FOOT (Left: Foot)  Patient Location: PACU  Anesthesia Type:MAC and Regional  Level of Consciousness: awake and alert   Airway & Oxygen Therapy: Patient Spontanous Breathing and Patient connected to face mask oxygen  Post-op Assessment: Report given to RN and Post -op Vital signs reviewed and stable  Post vital signs: Reviewed and stable  Last Vitals:  Vitals Value Taken Time  BP 133/57 09/08/22 0911  Temp    Pulse    Resp 20 09/08/22 0912  SpO2    Vitals shown include unvalidated device data.  Last Pain:  Vitals:   09/08/22 0606  TempSrc: Oral  PainSc: 0-No pain         Complications: No notable events documented.

## 2022-09-08 NOTE — Op Note (Signed)
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  POST-OPERATIVE DIAGNOSIS:  Same  PROCEDURE:  REMOVAL DEEP HARDWARE, FUSION TRANSTARSAL AND LISFRANC JOINT, CHEVRON AND AIKEN OSTEOTOMY great toe WEIL OSTEOTOMY 2ND METATARSAL LEFT FOOT C-arm fluoroscopy to verify alignment of the fusion and verify removal of deep retained hardware.  SURGEON:  Nadara Mustard, MD  PHYSICIAN ASSISTANT:None ANESTHESIA:   General  PREOPERATIVE INDICATIONS:  Steven Herring is a  42 y.o. male with a diagnosis of Hallux Valgus, Claw Toe, and Traumatic Arthritis Lisfranc Joint who failed conservative measures and elected for surgical management.    The risks benefits and alternatives were discussed with the patient preoperatively including but not limited to the risks of infection, bleeding, nerve injury, cardiopulmonary complications, the need for revision surgery, among others, and the patient was willing to proceed.  OPERATIVE IMPLANTS:   Implant Name Type Inv. Item Serial No. Manufacturer Lot No. LRB No. Used Action  PUTTY DBM STAGRAFT PLUS 2CC - [id number] Putty PUTTY DBM STAGRAFT PLUS 2CC  ZIMMER RECON(ORTH,TRAU,BIO,SG) [id number] Left 1 Implanted  STAPLE ASSEMBLY ARCUS 20X17 - [id number] Staple STAPLE ASSEMBLY ARCUS 20X17  ZIMMER RECON(ORTH,TRAU,BIO,SG) [id number] A Left 2 Implanted  KIT STAPLE ARCUS 20X17 STRL - [id number] Staple KIT STAPLE ARCUS 20X17 STRL  ZIMMER RECON(ORTH,TRAU,BIO,SG) [id number] A Left 1 Implanted  2.0 mm NL Screw x 12 Screw   ZIMMER  Left 1 Implanted    @ENCIMAGES @  OPERATIVE FINDINGS: En face be verified reduction across the transtarsal ligament as well as stable fixation of the bunion and claw toe Weil osteotomy.  OPERATIVE PROCEDURE: Patient was brought the operating room after undergoing a regional anesthetic.  After adequate levels anesthesia obtained patient's left  lower extremity was prepped using DuraPrep draped into a sterile field a timeout was called.  The previous dorsal incision was used this was carried down to the fascia and blunt dissection was used to develop the interval between the anterior tibial tendon and the EHL tendon.  This was carried down to bone and subperiosteal dissection was carried down to the transtarsal and Lisfranc complex.  Visualization showed stable fusion across the base of the first metatarsal medial cuneiform however there was unstable movement from the medial cuneiform to the navicular and cross the tarsal joint.  The oscillating saw was used to resect bone to identify the screw heads that were deep within the bone.  The 2 cannulated screws were removed without complications.  The transtarsal joint was then debrided with an oscillating saw and a curette back to bleeding viable subchondral bone.  The wound was irrigated with normal saline and 3 staples were used to stabilize the Lisfranc complex and the transtarsal complex.  The wound was irrigated normal saline incision closed using 2-0 nylon.  Attention was then focused on the great toe with the great toe overlapping the second toe.  A medial incision was made this was carried down through the retinaculum a football shaped the retinaculum was resected from the plantar aspect of the retinaculum.  A osteotomy was performed of the first metatarsal head followed by a chevron osteotomy the metatarsal head was translated laterally 4 mm and stabilized with a 0.0625 K wire a second ostectomy was again performed.  Patient still had valgus deformity and a Aiken osteotomy was performed through the base of the first metatarsal 1 cm distal to the joint surface.  The toe was straightened  and stabilized with a 0.0625 K wire.  The wound was irrigated with normal saline incision closed using 2-0 Vicryl to close the retinaculum and 2-0 nylon to close the skin.  Attention was then focused on the MTP joint of  the second toe.  A dorsal incision was made this was carried down to the MTP joint the extensor tendon was protected.  A Weil osteotomy was performed overhanging bone was resected metatarsal head was translated proximally and a 2 x 12 mm screw was used to stabilize the Weil osteotomy.  The wound was irrigated normal saline and the incision closed using 2-0 nylon.  Sterile dressing was applied patient was taken the PACU in stable condition.   DISCHARGE PLANNING:  Antibiotic duration: Preoperative antibiotics  Weightbearing: Nonweightbearing on the left  Pain medication: Prescription for Percocet  Dressing care/ Wound VAC: Dry dressing  Ambulatory devices: Crutches and kneeling scooter  Discharge to: Home.  Follow-up: In the office 1 week post operative.

## 2022-09-11 ENCOUNTER — Encounter (HOSPITAL_COMMUNITY): Payer: Self-pay | Admitting: Orthopedic Surgery

## 2022-09-11 NOTE — Telephone Encounter (Signed)
Is there anything that I need to do for the pt? How can I help? Or do I sign off on this message and have WC address?

## 2022-09-15 ENCOUNTER — Encounter: Payer: Self-pay | Admitting: Family

## 2022-09-15 ENCOUNTER — Ambulatory Visit (INDEPENDENT_AMBULATORY_CARE_PROVIDER_SITE_OTHER): Payer: No Typology Code available for payment source | Admitting: Family

## 2022-09-15 DIAGNOSIS — M21612 Bunion of left foot: Secondary | ICD-10-CM

## 2022-09-15 DIAGNOSIS — S93325A Dislocation of tarsometatarsal joint of left foot, initial encounter: Secondary | ICD-10-CM

## 2022-09-15 DIAGNOSIS — M205X2 Other deformities of toe(s) (acquired), left foot: Secondary | ICD-10-CM

## 2022-09-15 MED ORDER — OXYCODONE-ACETAMINOPHEN 10-325 MG PO TABS: 1.0000 | ORAL_TABLET | Freq: Four times a day (QID) | ORAL | 0 refills | Status: DC | PRN

## 2022-09-15 NOTE — Progress Notes (Signed)
   Post-Op Visit Note   Patient: Steven Herring           Date of Birth: September 27, 1980           MRN: [id number] Visit Date: 09/15/2022 PCP: Pcp, No  Chief Complaint:  Chief Complaint  Patient presents with   Left Foot - Routine Post Op    09/08/22 removal HDW fusion lisfranc joint, chevron and aiken GT, weil 2nd MT    HPI:  HPI The patient is a 42 year old gentleman seen status post removal of hardware fusion of the Lisfranc joint chevron and Akin of the great toe while with the second toe 2 pins in place Ortho Exam On examination right foot his incisions are all approximated with sutures well there is no gaping or drainage there is 1 drop of blood.  Pins plate in place his great toe is straight Visit Diagnoses: No diagnosis found.  Plan: Begin daily Dial soap cleansing dry dressings.  Continue nonweightbearing  Follow-Up Instructions: No follow-ups on file.   Imaging: No results found.  Orders:  No orders of the defined types were placed in this encounter.  Meds ordered this encounter  Medications   oxyCODONE-acetaminophen (PERCOCET) 10-325 MG tablet    Sig: Take 1 tablet by mouth every 6 (six) hours as needed for pain.    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  No family history on file.  Past Surgical History:  Procedure Laterality Date   FOOT ARTHRODESIS Left 05/20/2021   Procedure: LEFT MIDFOOT FUSION;  Surgeon: Nadara Mustard, MD;  Location: [facility] OR;  Service: Orthopedics;  Laterality: Left;   FOOT ARTHRODESIS Left 09/08/2022   Procedure: REMOVAL HARDWARE, FUSION LISFRANC JOINT, CHEVRON AND AIKEN OSTEOTOMY, WEIL OSTEOTOMY 2ND METATARSAL LEFT FOOT;  Surgeon: Nadara Mustard, MD;  Location: [facility] OR;  Service:  Orthopedics;  Laterality: Left;   Social History   Occupational History   Not on file  Tobacco Use   Smoking status: Every Day    Packs/day: 1    Types: Cigarettes   Smokeless tobacco: Never  Vaping Use   Vaping Use: Never used  Substance and Sexual Activity   Alcohol use: Not Currently   Drug use: Not Currently   Sexual activity: Yes

## 2022-09-18 ENCOUNTER — Ambulatory Visit: Payer: Self-pay | Admitting: Orthopedic Surgery

## 2022-09-18 ENCOUNTER — Other Ambulatory Visit (HOSPITAL_COMMUNITY): Payer: Self-pay

## 2022-09-20 ENCOUNTER — Ambulatory Visit (INDEPENDENT_AMBULATORY_CARE_PROVIDER_SITE_OTHER): Payer: No Typology Code available for payment source | Admitting: Family

## 2022-09-20 DIAGNOSIS — S93325A Dislocation of tarsometatarsal joint of left foot, initial encounter: Secondary | ICD-10-CM

## 2022-09-20 DIAGNOSIS — M21612 Bunion of left foot: Secondary | ICD-10-CM

## 2022-09-20 DIAGNOSIS — M205X2 Other deformities of toe(s) (acquired), left foot: Secondary | ICD-10-CM

## 2022-09-25 ENCOUNTER — Ambulatory Visit (INDEPENDENT_AMBULATORY_CARE_PROVIDER_SITE_OTHER): Payer: No Typology Code available for payment source | Admitting: Orthopedic Surgery

## 2022-09-25 ENCOUNTER — Telehealth: Payer: Self-pay | Admitting: Orthopedic Surgery

## 2022-09-25 ENCOUNTER — Encounter: Payer: Self-pay | Admitting: Orthopedic Surgery

## 2022-09-25 DIAGNOSIS — S93325A Dislocation of tarsometatarsal joint of left foot, initial encounter: Secondary | ICD-10-CM

## 2022-09-25 DIAGNOSIS — M21612 Bunion of left foot: Secondary | ICD-10-CM

## 2022-09-25 DIAGNOSIS — M205X2 Other deformities of toe(s) (acquired), left foot: Secondary | ICD-10-CM

## 2022-09-25 NOTE — Telephone Encounter (Signed)
Patient requesting handicap placard please advise 

## 2022-09-25 NOTE — Progress Notes (Signed)
Office Visit Note   Patient: Steven Herring           Date of Birth: Feb 09, 1981           MRN: [id number] Visit Date: 09/25/2022              Requested by: No referring provider defined for this encounter. PCP: Pcp, No  Chief Complaint  Patient presents with   Left Foot - Routine Post Op    09/08/22 removal HDW fusion lisfranc joint, chevron and aiken GT, weil 2nd MT         HPI: Patient is a 42 year old gentleman who presents 2 weeks status post revision fusion left foot Lisfranc complex as well as a chevron Aiken osteotomy of the great toe Weil osteotomy of the second metatarsal.  The sutures are intact and 1 pin is remaining.  Assessment & Plan: Visit Diagnoses:  1. Acquired claw toe, left   2. Bunion of great toe of left foot   3. Lisfranc dislocation, left, initial encounter     Plan: The pin was removed without complications the sutures are harvested.  Patient needs to continue nonweightbearing.  The importance of Achilles stretching was reviewed and stretching was demonstrated.  Anticipate out of work for about 3 months.  Obtain three-view radiographs of the left foot at follow-up  Follow-Up Instructions: Return in about 3 weeks (around 10/16/2022).   Ortho Exam  Patient is alert, oriented, no adenopathy, well-dressed, normal affect, normal respiratory effort. Examination the incision is well-healed and sutures are harvested the pin is removed without complication.  Patient is developing an equinus contracture currently about 20 degrees.  Patient was demonstrated Achilles stretching to be done multiple times nonweightbearing on the left foot.  Day and the importance of stretching was reviewed.  Patient will continue  Imaging: No results found. No images are attached to the encounter.  Labs: No results found for: "HGBA1C", "ESRSEDRATE", "CRP", "LABURIC", "REPTSTATUS", "GRAMSTAIN", "CULT", "LABORGA"   No results found for: "ALBUMIN", "PREALBUMIN", "CBC"  No  results found for: "MG" No results found for: "VD25OH"  No results found for: "PREALBUMIN"    Latest Ref Rng & Units 09/08/2022    6:25 AM 12/22/2021    6:20 PM 05/20/2021    5:49 AM  CBC EXTENDED  WBC 4.0 - 10.5 K/uL 4.8  5.2  5.5   RBC 4.22 - 5.81 MIL/uL 4.89  5.27  5.48   Hemoglobin 13.0 - 17.0 g/dL 40.9  81.1  91.4   HCT 39.0 - 52.0 % 42.2  45.5  47.9   Platelets 150 - 400 K/uL 270  232  263      There is no height or weight on file to calculate BMI.  Orders:  No orders of the defined types were placed in this encounter.  No orders of the defined types were placed in this encounter.    Procedures: No procedures performed  Clinical Data: No additional findings.  ROS:  All other systems negative, except as noted in the HPI. Review of Systems  Objective: Vital Signs: There were no vitals taken for this visit.  Specialty Comments:  No specialty comments available.  PMFS History: Patient Active Problem List   Diagnosis Date Noted   Bunion of great toe of left foot 09/08/2022   Acquired claw toe, left 09/08/2022   Lisfranc dislocation, left, initial encounter    Contusion of bone 12/29/2020   History reviewed. No pertinent past medical history.  History reviewed. No pertinent  family history.  Past Surgical History:  Procedure Laterality Date   FOOT ARTHRODESIS Left 05/20/2021   Procedure: LEFT MIDFOOT FUSION;  Surgeon: Nadara Mustard, MD;  Location: [facility] OR;  Service: Orthopedics;  Laterality: Left;   FOOT ARTHRODESIS Left 09/08/2022   Procedure: REMOVAL HARDWARE, FUSION LISFRANC JOINT, CHEVRON AND AIKEN OSTEOTOMY, WEIL OSTEOTOMY 2ND METATARSAL LEFT FOOT;  Surgeon: Nadara Mustard, MD;  Location: [facility] OR;  Service: Orthopedics;  Laterality: Left;   Social History   Occupational History   Not on file  Tobacco Use   Smoking status: Every Day    Packs/day: 1    Types: Cigarettes   Smokeless tobacco: Never  Vaping Use   Vaping Use: Never used  Substance and Sexual  Activity   Alcohol use: Not Currently   Drug use: Not Currently   Sexual activity: Yes

## 2022-10-06 ENCOUNTER — Encounter: Payer: Self-pay | Admitting: Family

## 2022-10-06 NOTE — Progress Notes (Signed)
   Post-Op Visit Note   Patient: Steven Herring           Date of Birth: 1980-08-17           MRN: [id number] Visit Date: 09/20/2022 PCP: Pcp, No  Chief Complaint:  Chief Complaint  Patient presents with   Left Foot - Routine Post Op    09/08/22 removal HDW fusion lisfranc joint, chevron and aiken GT, weil 2nd MT       HPI:  HPI The patient is a 42 year old gentleman seen status post removal of hardware with fusion Lisfranc joint chevron and Akin osteotomies of the great toe Weil osteotomy second metatarsal he has been doing dry dressings and nonweightbearing  Pin in place times 2 sutures in place as well  Feels unable to wear the boot or postop shoe due to swelling and pain Ortho Exam On examination of the left foot sutures in well-approximated incisions are healing well pin in place toes are straight.  Mild edema without erythema or warmth  Visit Diagnoses: No diagnosis found.  Plan: Great toe pain harvested without incident.  The patient would like to hold off on harvesting the dorsal foot pain they will continue to paint the pin tract with Neosporin continue dry dressing changes and nonweightbearing.  Follow-Up Instructions: No follow-ups on file.   Imaging: No results found.  Orders:  No orders of the defined types were placed in this encounter.  No orders of the defined types were placed in this encounter.    PMFS History: Patient Active Problem List   Diagnosis Date Noted   Bunion of great toe of left foot 09/08/2022   Acquired claw toe, left 09/08/2022   Lisfranc dislocation, left, initial encounter    Contusion of bone 12/29/2020   No past medical history on file.  No family history on file.  Past Surgical History:  Procedure Laterality Date   FOOT ARTHRODESIS Left 05/20/2021   Procedure: LEFT MIDFOOT FUSION;  Surgeon: Nadara Mustard, MD;  Location: [facility] OR;  Service: Orthopedics;  Laterality: Left;   FOOT ARTHRODESIS Left 09/08/2022   Procedure: REMOVAL  HARDWARE, FUSION LISFRANC JOINT, CHEVRON AND AIKEN OSTEOTOMY, WEIL OSTEOTOMY 2ND METATARSAL LEFT FOOT;  Surgeon: Nadara Mustard, MD;  Location: [facility] OR;  Service: Orthopedics;  Laterality: Left;   Social History   Occupational History   Not on file  Tobacco Use   Smoking status: Every Day    Packs/day: 1    Types: Cigarettes   Smokeless tobacco: Never  Vaping Use   Vaping Use: Never used  Substance and Sexual Activity   Alcohol use: Not Currently   Drug use: Not Currently   Sexual activity: Yes

## 2022-10-20 ENCOUNTER — Ambulatory Visit (INDEPENDENT_AMBULATORY_CARE_PROVIDER_SITE_OTHER): Payer: No Typology Code available for payment source | Admitting: Family

## 2022-10-20 ENCOUNTER — Other Ambulatory Visit (INDEPENDENT_AMBULATORY_CARE_PROVIDER_SITE_OTHER): Payer: No Typology Code available for payment source

## 2022-10-20 ENCOUNTER — Other Ambulatory Visit: Payer: Self-pay | Admitting: Orthopedic Surgery

## 2022-10-20 ENCOUNTER — Telehealth: Payer: Self-pay | Admitting: Orthopedic Surgery

## 2022-10-20 DIAGNOSIS — M79672 Pain in left foot: Secondary | ICD-10-CM

## 2022-10-20 MED ORDER — OXYCODONE-ACETAMINOPHEN 10-325 MG PO TABS: 1.0000 | ORAL_TABLET | Freq: Three times a day (TID) | ORAL | 0 refills | Status: DC | PRN

## 2022-10-20 NOTE — Telephone Encounter (Signed)
Pt was in the office today with Denny Peon and the dictation is not available yet but pt states that he was to have pain medication refill. There is noting in the chart can you please refill?

## 2022-10-20 NOTE — Progress Notes (Signed)
   Post-Op Visit Note   Patient: Steven Herring           Date of Birth: 07-11-1980           MRN: [id number] Visit Date: 10/20/2022 PCP: Pcp, No  Chief Complaint:  Chief Complaint  Patient presents with   Left Foot - Routine Post Op    REMOVAL DEEP HARDWARE, FUSION TRANSTARSAL AND LISFRANC JOINT, CHEVRON AND AIKEN OSTEOTOMY great toe WEIL OSTEOTOMY 2ND METATARSAL LEFT FOOT     HPI:  HPI The patient is a 42 year old gentleman who presents status post removal hardware fusion Lisfranc joint, chevron and Akin osteotomies of the great toe, Weil osteotomy of the second metatarsal on June 7.  The patient continues using a kneeling scooter with no protective shoe wear no boot no postop shoe continues nonweightbearing reports has been working on heel cord stretching.  Reports he has had some reduction in his pain some improvement in his range of motion however when he does try to stand barefoot he has mild pain Ortho Exam On examination of the right foot the incisions are both well-healed.  He continues with a equinus contracture currently about 10 degrees.  Demonstrated the heel cord stretching again.  There is mild elevation of the second third and fourth toes.  The great toe is straight.  Visit Diagnoses:  1. Pain in left foot     Plan: Plan to follow-up in 2 weeks with Dr. Lajoyce Corners.  Three-view radiographs of the left foot follow-up.  Will continue out of work.  Encouraged him to begin weightbearing in his cam walker also discussed the importance of Achilles stretching as well as scar massage pressure and range of motion to the second through fourth toes.  Follow-Up Instructions: No follow-ups on file.   Imaging: No results found.  Orders:  Orders Placed This Encounter  Procedures   XR Foot Complete Left   No orders of the defined types were placed in this encounter.    PMFS History: Patient Active Problem List   Diagnosis Date Noted   Bunion of great toe of left foot  09/08/2022   Acquired claw toe, left 09/08/2022   Lisfranc dislocation, left, initial encounter    Contusion of bone 12/29/2020   No past medical history on file.  No family history on file.  Past Surgical History:  Procedure Laterality Date   FOOT ARTHRODESIS Left 05/20/2021   Procedure: LEFT MIDFOOT FUSION;  Surgeon: Nadara Mustard, MD;  Location: [facility] OR;  Service: Orthopedics;  Laterality: Left;   FOOT ARTHRODESIS Left 09/08/2022   Procedure: REMOVAL HARDWARE, FUSION LISFRANC JOINT, CHEVRON AND AIKEN OSTEOTOMY, WEIL OSTEOTOMY 2ND METATARSAL LEFT FOOT;  Surgeon: Nadara Mustard, MD;  Location: [facility] OR;  Service: Orthopedics;  Laterality: Left;   Social History   Occupational History   Not on file  Tobacco Use   Smoking status: Every Day    Current packs/day: 1.00    Types: Cigarettes   Smokeless tobacco: Never  Vaping Use   Vaping status: Never Used  Substance and Sexual Activity   Alcohol use: Not Currently   Drug use: Not Currently   Sexual activity: Yes

## 2022-10-20 NOTE — Telephone Encounter (Signed)
2nd attempt pt came in today for handicap placard

## 2022-10-20 NOTE — Telephone Encounter (Signed)
Pt called in stating his pain meds were not at the pharmacy advised pt it may have to do with the outage please advise

## 2022-10-20 NOTE — Telephone Encounter (Signed)
Placard picked up

## 2022-10-20 NOTE — Telephone Encounter (Signed)
Patient could have  requested handicap placard during his appointment. I will have one ready for him to pick up at office. Will notify once ready

## 2022-10-21 ENCOUNTER — Encounter: Payer: Self-pay | Admitting: Family

## 2022-11-13 ENCOUNTER — Telehealth: Payer: Self-pay | Admitting: Orthopedic Surgery

## 2022-11-13 NOTE — Telephone Encounter (Signed)
Patient called asked if he could get a sooner appt because his foot is really hurting. Phone number [phone]

## 2022-11-14 NOTE — Telephone Encounter (Signed)
I called pt and it went right to vm. Mailbox is full and was not able to leave a message. Will hold and try again. Was going to offer appt with appt with erin tomorrow.

## 2022-11-15 ENCOUNTER — Ambulatory Visit (INDEPENDENT_AMBULATORY_CARE_PROVIDER_SITE_OTHER): Payer: No Typology Code available for payment source | Admitting: Family

## 2022-11-15 ENCOUNTER — Encounter: Payer: Self-pay | Admitting: Family

## 2022-11-15 DIAGNOSIS — M21612 Bunion of left foot: Secondary | ICD-10-CM

## 2022-11-15 DIAGNOSIS — S93325A Dislocation of tarsometatarsal joint of left foot, initial encounter: Secondary | ICD-10-CM

## 2022-11-15 DIAGNOSIS — M205X2 Other deformities of toe(s) (acquired), left foot: Secondary | ICD-10-CM

## 2022-11-15 MED ORDER — GABAPENTIN 300 MG PO CAPS: 300.0000 mg | ORAL_CAPSULE | Freq: Three times a day (TID) | ORAL | 1 refills | Status: AC

## 2022-11-15 MED ORDER — OXYCODONE-ACETAMINOPHEN 5-325 MG PO TABS: 1.0000 | ORAL_TABLET | Freq: Four times a day (QID) | ORAL | 0 refills | Status: AC | PRN

## 2022-11-15 NOTE — Telephone Encounter (Signed)
Bethann Berkshire advised she made pt appt for 11/20/2022

## 2022-11-15 NOTE — Progress Notes (Signed)
   Post-Op Visit Note   Patient: Steven Herring           Date of Birth: 03-15-1981           MRN: [id number] Visit Date: 11/15/2022 PCP: Pcp, No  Chief Complaint: No chief complaint on file.   HPI:  HPI Patient is a 42 year old gentleman who is status post removal hardware fusion Lisfranc joint with chevron Akin osteotomies of great toe, Weil osteotomy of the second metatarsal.  The surgery was on June 7.   Ortho Exam On examination right foot.  The incisions are well-healed there is no edema no erythema.  He has dorsiflexion to neutral.  The toes are straight  Visit Diagnoses: No diagnosis found.  Plan: will trial Neurontin for his neuropathic pain in the foot and ankle.  Will also refill his pain medication and have recommended he begin physical therapy for gait training strengthening range of motion.  Have discussed possibility of an FCE if any in the future.  Follow-Up Instructions: No follow-ups on file.   Imaging: No results found.  Orders:  No orders of the defined types were placed in this encounter.  No orders of the defined types were placed in this encounter.    PMFS History: Patient Active Problem List   Diagnosis Date Noted   Bunion of great toe of left foot 09/08/2022   Acquired claw toe, left 09/08/2022   Lisfranc dislocation, left, initial encounter    Contusion of bone 12/29/2020   History reviewed. No pertinent past medical history.  History reviewed. No pertinent family history.  Past Surgical History:  Procedure Laterality Date   FOOT ARTHRODESIS Left 05/20/2021   Procedure: LEFT MIDFOOT FUSION;  Surgeon: Nadara Mustard, MD;  Location: [facility] OR;  Service: Orthopedics;  Laterality: Left;   FOOT ARTHRODESIS Left 09/08/2022   Procedure: REMOVAL HARDWARE, FUSION LISFRANC JOINT, CHEVRON AND AIKEN OSTEOTOMY, WEIL OSTEOTOMY 2ND METATARSAL LEFT FOOT;  Surgeon: Nadara Mustard, MD;  Location: [facility] OR;  Service: Orthopedics;  Laterality: Left;   Social History    Occupational History   Not on file  Tobacco Use   Smoking status: Every Day    Current packs/day: 1.00    Types: Cigarettes   Smokeless tobacco: Never  Vaping Use   Vaping status: Never Used  Substance and Sexual Activity   Alcohol use: Not Currently   Drug use: Not Currently   Sexual activity: Yes

## 2022-11-20 ENCOUNTER — Ambulatory Visit: Payer: Self-pay | Admitting: Orthopedic Surgery

## 2022-12-22 ENCOUNTER — Other Ambulatory Visit: Payer: Self-pay

## 2022-12-22 ENCOUNTER — Ambulatory Visit: Payer: Self-pay

## 2022-12-22 ENCOUNTER — Ambulatory Visit (INDEPENDENT_AMBULATORY_CARE_PROVIDER_SITE_OTHER): Payer: No Typology Code available for payment source | Admitting: Family

## 2022-12-22 DIAGNOSIS — S93325A Dislocation of tarsometatarsal joint of left foot, initial encounter: Secondary | ICD-10-CM

## 2022-12-22 DIAGNOSIS — M205X2 Other deformities of toe(s) (acquired), left foot: Secondary | ICD-10-CM

## 2022-12-25 NOTE — Progress Notes (Unsigned)
Office Visit Note   Patient: Steven Herring           Date of Birth: 25-Sep-1980           MRN: [id number] Visit Date: 12/22/2022              Requested by: No referring provider defined for this encounter. PCP: Pcp, No  Chief Complaint  Patient presents with  . Left Foot - Follow-up    09/08/2022 left foot removal HDW fusion Lisfranc joint Chevron, Aiken osteotomy and Weil 2nd MT       HPI: The patient is a 42 year old gentleman who presents in follow-up for ongoing pain to his left foot.  He is status post removal hardware fusion of the Lisfranc joint chevron and Akin osteotomy while seeing the second metatarsal.  Today he is in regular shoewear full weightbearing on the left foot he reports ongoing pain over the dorsum of the foot over the area of his incision this is chronic aching throbbing he has pain in the second time as well he has not yet proceed with physical therapy he has not undergone FCE.  He feels he cannot return to work due to foot pain  Assessment & Plan: Visit Diagnoses:  1. Acquired claw toe, left   2. Lisfranc dislocation, left, initial encounter     Plan:   Follow-Up Instructions: No follow-ups on file.   Ortho Exam  Patient is alert, oriented, no adenopathy, well-dressed, normal affect, normal respiratory effort. ***  Imaging: No results found. No images are attached to the encounter.  Labs: No results found for: "HGBA1C", "ESRSEDRATE", "CRP", "LABURIC", "REPTSTATUS", "GRAMSTAIN", "CULT", "LABORGA"   No results found for: "ALBUMIN", "PREALBUMIN", "CBC"  No results found for: "MG" No results found for: "VD25OH"  No results found for: "PREALBUMIN"    Latest Ref Rng & Units 09/08/2022    6:25 AM 12/22/2021    6:20 PM 05/20/2021    5:49 AM  CBC EXTENDED  WBC 4.0 - 10.5 K/uL 4.8  5.2  5.5   RBC 4.22 - 5.81 MIL/uL 4.89  5.27  5.48   Hemoglobin 13.0 - 17.0 g/dL 21.3  08.6  57.8   HCT 39.0 - 52.0 % 42.2  45.5  47.9   Platelets 150 - 400 K/uL 270   232  263      There is no height or weight on file to calculate BMI.  Orders:  Orders Placed This Encounter  Procedures  . XR Foot Complete Left   No orders of the defined types were placed in this encounter.    Procedures: No procedures performed  Clinical Data: No additional findings.  ROS:  All other systems negative, except as noted in the HPI. Review of Systems  Objective: Vital Signs: There were no vitals taken for this visit.  Specialty Comments:  No specialty comments available.  PMFS History: Patient Active Problem List   Diagnosis Date Noted  . Bunion of great toe of left foot 09/08/2022  . Acquired claw toe, left 09/08/2022  . Lisfranc dislocation, left, initial encounter   . Contusion of bone 12/29/2020   No past medical history on file.  No family history on file.  Past Surgical History:  Procedure Laterality Date  . FOOT ARTHRODESIS Left 05/20/2021   Procedure: LEFT MIDFOOT FUSION;  Surgeon: Nadara Mustard, MD;  Location: [facility] OR;  Service: Orthopedics;  Laterality: Left;  . FOOT ARTHRODESIS Left 09/08/2022   Procedure: REMOVAL HARDWARE, FUSION LISFRANC JOINT, CHEVRON  AND Quintella Reichert OSTEOTOMY, WEIL OSTEOTOMY 2ND METATARSAL LEFT FOOT;  Surgeon: Nadara Mustard, MD;  Location: [facility] OR;  Service: Orthopedics;  Laterality: Left;   Social History   Occupational History  . Not on file  Tobacco Use  . Smoking status: Every Day    Current packs/day: 1.00    Types: Cigarettes  . Smokeless tobacco: Never  Vaping Use  . Vaping status: Never Used  Substance and Sexual Activity  . Alcohol use: Not Currently  . Drug use: Not Currently  . Sexual activity: Yes

## 2022-12-26 ENCOUNTER — Encounter: Payer: Self-pay | Admitting: Family

## 2023-01-05 ENCOUNTER — Other Ambulatory Visit: Payer: Self-pay

## 2023-01-09 ENCOUNTER — Ambulatory Visit
Admission: RE | Admit: 2023-01-09 | Discharge: 2023-01-09 | Disposition: A | Discharge status: Home / Self Care | Payer: Worker's Compensation | Source: Ambulatory Visit | Attending: Family | Admitting: Family

## 2023-01-09 DIAGNOSIS — M205X2 Other deformities of toe(s) (acquired), left foot: Secondary | ICD-10-CM

## 2023-01-09 DIAGNOSIS — S93325A Dislocation of tarsometatarsal joint of left foot, initial encounter: Secondary | ICD-10-CM

## 2023-01-23 ENCOUNTER — Other Ambulatory Visit: Payer: Self-pay

## 2023-02-13 IMAGING — DX DG FOREARM 2V*R*
2 series · 2 of 2 positions shown · non-contrast
Comparison: None.

CLINICAL DATA: Right forearm laceration from glass. Rule out
foreign body.

EXAM:
RIGHT FOREARM - 2 VIEW

[forearm ap]
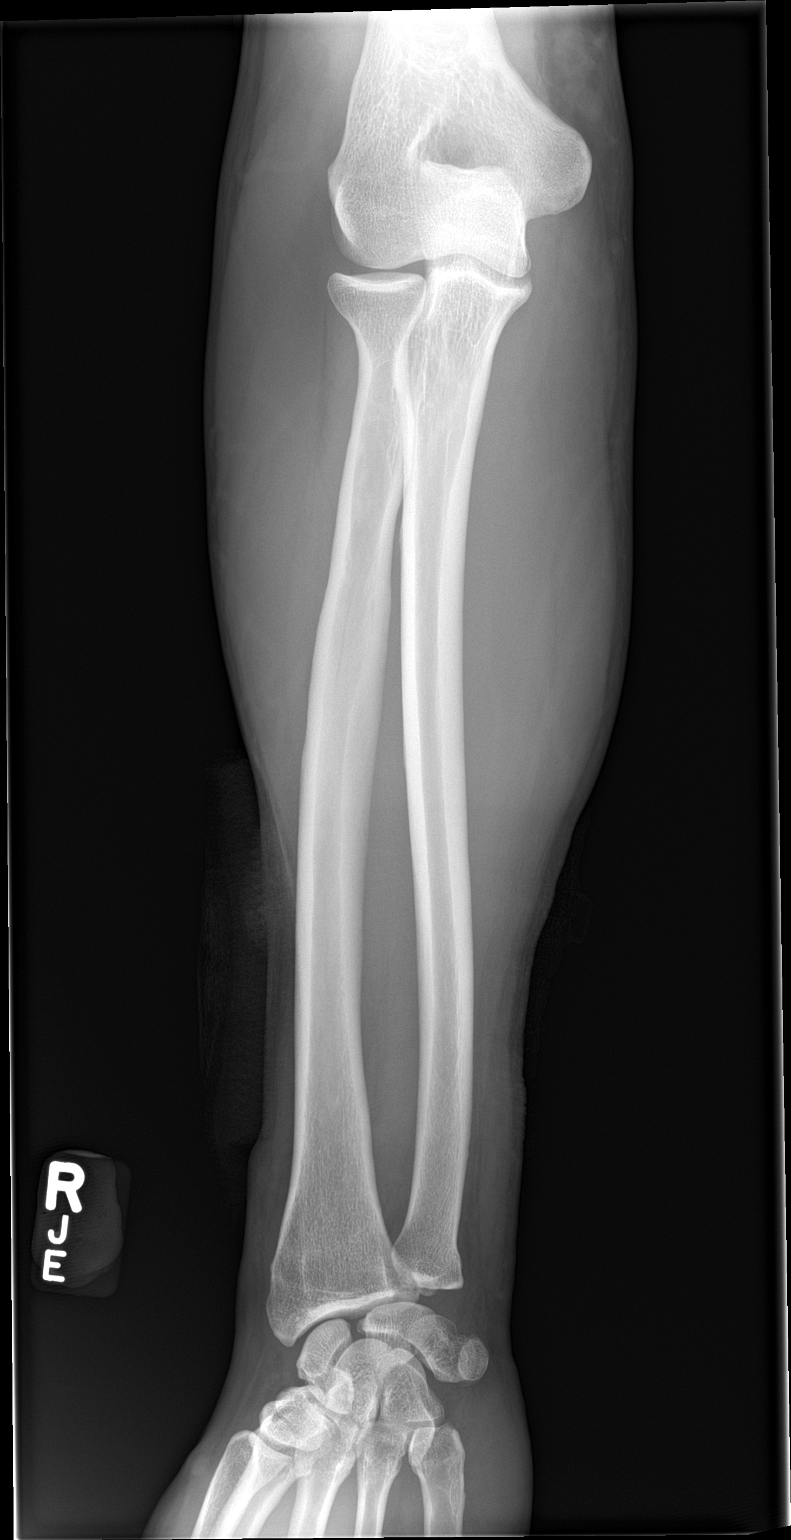

[forearm lat]
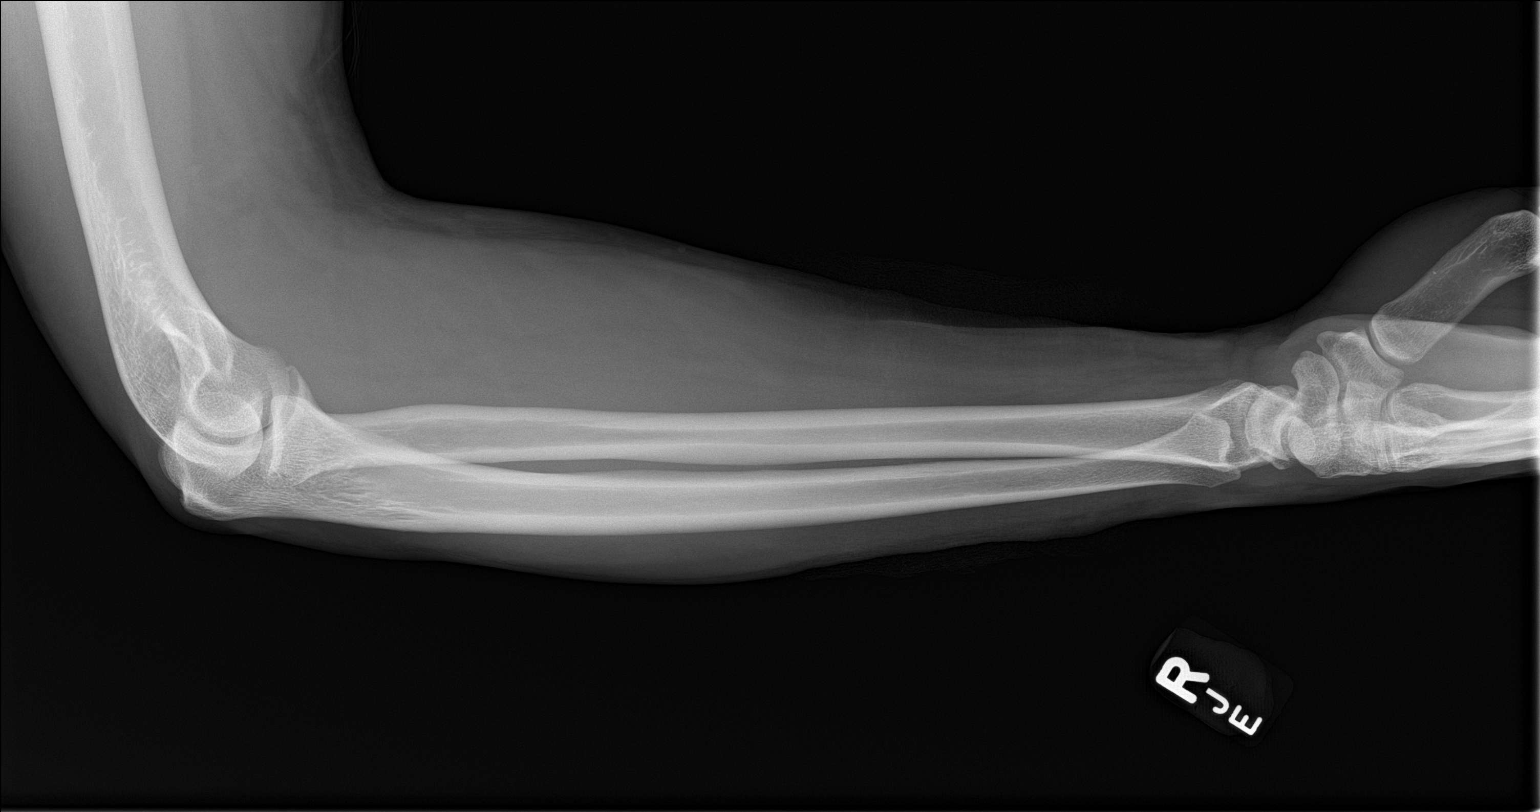

[2 of 2 positions shown; findings below may reference images not displayed]

FINDINGS: Normal alignment. No fracture or dislocation. Surgical dressing
applied to the radial soft tissues at the level of the distal
diaphysis of the radius. No retained radiopaque foreign body
identified.
IMPRESSION: No retained radiopaque foreign body identified.

## 2023-02-26 ENCOUNTER — Ambulatory Visit (INDEPENDENT_AMBULATORY_CARE_PROVIDER_SITE_OTHER): Payer: Self-pay | Admitting: Orthopedic Surgery

## 2023-02-26 ENCOUNTER — Encounter: Payer: Self-pay | Admitting: Orthopedic Surgery

## 2023-02-26 ENCOUNTER — Ambulatory Visit: Payer: Self-pay | Admitting: Orthopedic Surgery

## 2023-02-26 DIAGNOSIS — M79672 Pain in left foot: Secondary | ICD-10-CM

## 2023-02-26 DIAGNOSIS — S93325A Dislocation of tarsometatarsal joint of left foot, initial encounter: Secondary | ICD-10-CM

## 2023-02-26 DIAGNOSIS — M21612 Bunion of left foot: Secondary | ICD-10-CM | POA: Diagnosis not present

## 2023-02-26 DIAGNOSIS — M205X2 Other deformities of toe(s) (acquired), left foot: Secondary | ICD-10-CM | POA: Diagnosis not present

## 2023-02-26 MED ORDER — OXYCODONE-ACETAMINOPHEN 10-325 MG PO TABS: 1.0000 | ORAL_TABLET | Freq: Three times a day (TID) | ORAL | 0 refills | Status: AC | PRN

## 2023-02-26 NOTE — Progress Notes (Signed)
Office Visit Note   Patient: Steven Herring           Date of Birth: March 01, 1981           MRN: [id number] Visit Date: 02/26/2023              Requested by: No referring provider defined for this encounter. PCP: Pcp, No  Chief Complaint  Patient presents with   Left Foot - Follow-up    09/08/2022 left foot removal HDW fusion Lisfranc joint Chevron, Aiken osteotomy and Weil 2nd MT  Review CT scan 01/09/2023      HPI: Patient is a 42 year old gentleman status post work-related injury to his left foot.  Patient had fusion across the Lisfranc complex as well as bunion and claw toes surgery of the second toe.  Patient is status post CT scan of the left foot.  Patient states he is still on Neurontin and takes 10 mg Percocet for pain, patient states this was ordered by [place].  Patient states that shower water causes the dorsum of his foot to hurt.  Patient states that his shower chair is broken and he has extra-depth shoes and orthotics from [employer] and states that a feel different on his feet.  Patient states that there is wrinkling in the leather on the shoe on the left foot.  Assessment & Plan: Visit Diagnoses:  1. Acquired claw toe, left   2. Lisfranc dislocation, left, initial encounter   3. Bunion of great toe of left foot   4. Pain in left foot     Plan: Discussed that from a surgical standpoint we could fuse the talonavicular joint which is the area of maximal pain.  We could revise the fusion of the navicular medial cuneiform joint as a fibrous union.  Recommended against surgical intervention for the clawing of the lesser toes.  Patient states he like to hold on surgery at this time and would like to follow-up after Christmas.  A prescription was provided for Percocet for pain.  Discussed that if patient requires long-term pain management we would need to set him up with a pain clinic.  Follow-Up Instructions: Return in about 4 weeks (around 03/26/2023).   Ortho Exam  Patient is  alert, oriented, no adenopathy, well-dressed, normal affect, normal respiratory effort. Examination patient has fixed flexion of the lesser toes.  There are no open ulcers.  Patient points to the talonavicular joint as being the area of maximal pain.  He is tender to palpation across the talonavicular joint as well as the navicular medial cuneiform.  Patient is tender palpation over the navicular medially.  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  The Lisfranc complex is well-healed and the bunion and Weil osteotomy sites are well-healed.  Imaging: No results found. No images are attached to the encounter.  Labs: No results found for: "HGBA1C", "ESRSEDRATE", "CRP", "LABURIC", "REPTSTATUS", "GRAMSTAIN", "CULT", "LABORGA"   No results found for: "ALBUMIN", "PREALBUMIN", "CBC"  No results found for: "MG" No results found for: "VD25OH"  No results found for: "PREALBUMIN"    Latest Ref Rng & Units 09/08/2022    6:25 AM 12/22/2021    6:20 PM 05/20/2021    5:49 AM  CBC EXTENDED  WBC 4.0 - 10.5 K/uL 4.8  5.2  5.5   RBC 4.22 - 5.81 MIL/uL 4.89  5.27  5.48   Hemoglobin  13.0 - 17.0 g/dL 16.1  09.6  04.5   HCT 39.0 - 52.0 % 42.2  45.5  47.9   Platelets 150 - 400 K/uL 270  232  263      There is no height or weight on file to calculate BMI.  Orders:  No orders of the defined types were placed in this encounter.  No orders of the defined types were placed in this encounter.    Procedures: No procedures performed  Clinical Data: No additional findings.  ROS:  All other systems negative, except as noted in the HPI. Review of Systems  Objective: Vital Signs: There were no vitals taken for this visit.  Specialty Comments:  No specialty comments available.  PMFS History: Patient Active Problem List   Diagnosis Date Noted   Bunion of great toe  of left foot 09/08/2022   Acquired claw toe, left 09/08/2022   Lisfranc dislocation, left, initial encounter    Contusion of bone 12/29/2020   History reviewed. No pertinent past medical history.  History reviewed. No pertinent family history.  Past Surgical History:  Procedure Laterality Date   FOOT ARTHRODESIS Left 05/20/2021   Procedure: LEFT MIDFOOT FUSION;  Surgeon: Nadara Mustard, MD;  Location: [facility] OR;  Service: Orthopedics;  Laterality: Left;   FOOT ARTHRODESIS Left 09/08/2022   Procedure: REMOVAL HARDWARE, FUSION LISFRANC JOINT, CHEVRON AND AIKEN OSTEOTOMY, WEIL OSTEOTOMY 2ND METATARSAL LEFT FOOT;  Surgeon: Nadara Mustard, MD;  Location: [facility] OR;  Service: Orthopedics;  Laterality: Left;   Social History   Occupational History   Not on file  Tobacco Use   Smoking status: Every Day    Current packs/day: 1.00    Types: Cigarettes   Smokeless tobacco: Never  Vaping Use   Vaping status: Never Used  Substance and Sexual Activity   Alcohol use: Not Currently   Drug use: Not Currently   Sexual activity: Yes

## 2023-04-09 ENCOUNTER — Encounter: Payer: Self-pay | Admitting: Orthopedic Surgery

## 2023-04-09 ENCOUNTER — Ambulatory Visit: Payer: Self-pay | Admitting: Orthopedic Surgery

## 2023-04-09 DIAGNOSIS — S93325A Dislocation of tarsometatarsal joint of left foot, initial encounter: Secondary | ICD-10-CM

## 2023-04-09 DIAGNOSIS — M79672 Pain in left foot: Secondary | ICD-10-CM

## 2023-04-09 DIAGNOSIS — M21612 Bunion of left foot: Secondary | ICD-10-CM

## 2023-04-09 DIAGNOSIS — M205X2 Other deformities of toe(s) (acquired), left foot: Secondary | ICD-10-CM

## 2023-04-09 NOTE — Progress Notes (Signed)
 Office Visit Note   Patient: Steven Herring           Date of Birth: Mar 13, 1981           MRN: [id number] Visit Date: 04/09/2023              Requested by: No referring provider defined for this encounter. PCP: Pcp, No  Chief Complaint  Patient presents with   Left Foot - Follow-up      HPI: Patient is a 43 year old gentleman who is seen in follow-up for his left foot.  Patient underwent internal fixation for Lisfranc fracture dislocation as well as a chevron osteotomy for the great toe and Weil osteotomy for the second metatarsal.  Patient feels like he has an increased space between the great toe and second toe and has increased clawing of toes 3 and 4.  Patient states he only has 1 pair of shoes.  Assessment & Plan: Visit Diagnoses:  1. Lisfranc dislocation, left, initial encounter   2. Bunion of great toe of left foot   3. Acquired claw toe, left   4. Pain in left foot     Plan: Symptomatically patient's foot is doing much better.  A prescription was written for patient to obtain a new pair of extra-depth Apex shoes from [employer] as well as a new custom orthotic.  Patient is symptomatic over his posterior tibial tendon and would benefit from a extra pair of shoes and new orthotics.  Recommended that he continue wearing the compression socks for the varicose veins.  Nails were trimmed x 5 without complication.  Follow-Up Instructions: Return in about 3 months (around 07/08/2023).   Ortho Exam  Patient is alert, oriented, no adenopathy, well-dressed, normal affect, normal respiratory effort. Examination patient's midfoot is not painful with attempted distraction across the midfoot.  Patient is symptomatic where the posterior tibial tendon attaches medially however he has no pain to palpation along the course of the posterior tibial tendon.  Patient has increased clawing of toes 3 and 4 the second toe is straighter than toes 3 and 4.  There is no overlapping of the great toe and  second toe.  There is no cellulitis he does have varicose veins.  No open venous ulcers.  The nails were trimmed x 5 without complication patient is unable to safely trim the nails on his own.  Patient only has 1 pair of shoes and 1 pair of orthotics to wear.  Right  Imaging: No results found. No images are attached to the encounter.  Labs: No results found for: HGBA1C, ESRSEDRATE, CRP, LABURIC, REPTSTATUS, GRAMSTAIN, CULT, LABORGA   No results found for: ALBUMIN, PREALBUMIN, CBC  No results found for: MG No results found for: VD25OH  No results found for: PREALBUMIN    Latest Ref Rng & Units 09/08/2022    6:25 AM 12/22/2021    6:20 PM 05/20/2021    5:49 AM  CBC EXTENDED  WBC 4.0 - 10.5 K/uL 4.8  5.2  5.5   RBC 4.22 - 5.81 MIL/uL 4.89  5.27  5.48   Hemoglobin 13.0 - 17.0 g/dL 86.4  85.1  84.4   HCT 39.0 - 52.0 % 42.2  45.5  47.9   Platelets 150 - 400 K/uL 270  232  263      There is no height or weight on file to calculate BMI.  Orders:  No orders of the defined types were placed in this encounter.  No orders of the defined types  were placed in this encounter.    Procedures: No procedures performed  Clinical Data: No additional findings.  ROS:  All other systems negative, except as noted in the HPI. Review of Systems  Objective: Vital Signs: There were no vitals taken for this visit.  Specialty Comments:  No specialty comments available.  PMFS History: Patient Active Problem List   Diagnosis Date Noted   Bunion of great toe of left foot 09/08/2022   Acquired claw toe, left 09/08/2022   Lisfranc dislocation, left, initial encounter    Contusion of bone 12/29/2020   History reviewed. No pertinent past medical history.  History reviewed. No pertinent family history.  Past Surgical History:  Procedure Laterality Date   FOOT ARTHRODESIS Left 05/20/2021   Procedure: LEFT MIDFOOT FUSION;  Surgeon: Harden Jerona GAILS, MD;  Location: [facility]  OR;  Service: Orthopedics;  Laterality: Left;   FOOT ARTHRODESIS Left 09/08/2022   Procedure: REMOVAL HARDWARE, FUSION LISFRANC JOINT, CHEVRON AND AIKEN OSTEOTOMY, WEIL OSTEOTOMY 2ND METATARSAL LEFT FOOT;  Surgeon: Harden Jerona GAILS, MD;  Location: [facility] OR;  Service: Orthopedics;  Laterality: Left;   Social History   Occupational History   Not on file  Tobacco Use   Smoking status: Every Day    Current packs/day: 1.00    Types: Cigarettes   Smokeless tobacco: Never  Vaping Use   Vaping status: Never Used  Substance and Sexual Activity   Alcohol use: Not Currently   Drug use: Not Currently   Sexual activity: Yes

## 2023-04-27 ENCOUNTER — Telehealth: Payer: Self-pay | Admitting: Orthopedic Surgery

## 2023-04-27 ENCOUNTER — Encounter: Payer: Self-pay | Admitting: Orthopedic Surgery

## 2023-04-27 NOTE — Telephone Encounter (Signed)
Letter in pt's chart. Steven Herring is informed to send this to his worker's comp

## 2023-04-27 NOTE — Telephone Encounter (Signed)
Patients worker comp needs a work status note from his visit on 04/09/23.

## 2023-07-09 ENCOUNTER — Ambulatory Visit: Payer: Self-pay | Admitting: Orthopedic Surgery

## 2023-07-09 ENCOUNTER — Encounter: Payer: Self-pay | Admitting: Orthopedic Surgery

## 2023-07-09 DIAGNOSIS — M21612 Bunion of left foot: Secondary | ICD-10-CM

## 2023-07-09 DIAGNOSIS — M205X2 Other deformities of toe(s) (acquired), left foot: Secondary | ICD-10-CM

## 2023-07-09 DIAGNOSIS — S93325A Dislocation of tarsometatarsal joint of left foot, initial encounter: Secondary | ICD-10-CM

## 2023-07-09 DIAGNOSIS — M79672 Pain in left foot: Secondary | ICD-10-CM

## 2023-07-09 NOTE — Progress Notes (Signed)
 Office Visit Note   Patient: Steven Herring           Date of Birth: 11/28/1980           MRN: [id number] Visit Date: 07/09/2023              Requested by: No referring provider defined for this encounter. PCP: Pcp, No  Chief Complaint  Patient presents with   Left Foot - Follow-up      HPI: Patient is a 43 year old gentleman who is status post internal fixation for Lisfranc fracture dislocation as well as a chevron osteotomy of the first metatarsal and Weil osteotomy for the second metatarsal.  Patient has previously been provided a prescription for extra-depth shoes and custom orthotics.  Patient states this has not been filled yet.  Assessment & Plan: Visit Diagnoses:  1. Lisfranc dislocation, left, initial encounter   2. Bunion of great toe of left foot   3. Acquired claw toe, left   4. Pain in left foot     Plan: Patient was provided a new prescription for extra-depth shoes custom orthotics and a carbon plate for the left shoe.  Follow-Up Instructions: Return in about 2 months (around 09/08/2023).   Ortho Exam  Patient is alert, oriented, no adenopathy, well-dressed, normal affect, normal respiratory effort. Examination patient ambulates with a cane.  He has pain circumferentially around the arch.  He has clawing of the lesser toes.  There is no redness no cellulitis no open wounds no signs of infection.  Imaging: No results found. No images are attached to the encounter.  Labs: No results found for: "HGBA1C", "ESRSEDRATE", "CRP", "LABURIC", "REPTSTATUS", "GRAMSTAIN", "CULT", "LABORGA"   No results found for: "ALBUMIN", "PREALBUMIN", "CBC"  No results found for: "MG" No results found for: "VD25OH"  No results found for: "PREALBUMIN"    Latest Ref Rng & Units 09/08/2022    6:25 AM 12/22/2021    6:20 PM 05/20/2021    5:49 AM  CBC EXTENDED  WBC 4.0 - 10.5 K/uL 4.8  5.2  5.5   RBC 4.22 - 5.81 MIL/uL 4.89  5.27  5.48   Hemoglobin 13.0 - 17.0 g/dL 78.2  95.6   21.3   HCT 39.0 - 52.0 % 42.2  45.5  47.9   Platelets 150 - 400 K/uL 270  232  263      There is no height or weight on file to calculate BMI.  Orders:  No orders of the defined types were placed in this encounter.  No orders of the defined types were placed in this encounter.    Procedures: No procedures performed  Clinical Data: No additional findings.  ROS:  All other systems negative, except as noted in the HPI. Review of Systems  Objective: Vital Signs: There were no vitals taken for this visit.  Specialty Comments:  No specialty comments available.  PMFS History: Patient Active Problem List   Diagnosis Date Noted   Bunion of great toe of left foot 09/08/2022   Acquired claw toe, left 09/08/2022   Lisfranc dislocation, left, initial encounter    Contusion of bone 12/29/2020   History reviewed. No pertinent past medical history.  History reviewed. No pertinent family history.  Past Surgical History:  Procedure Laterality Date   FOOT ARTHRODESIS Left 05/20/2021   Procedure: LEFT MIDFOOT FUSION;  Surgeon: Nadara Mustard, MD;  Location: [facility] OR;  Service: Orthopedics;  Laterality: Left;   FOOT ARTHRODESIS Left 09/08/2022   Procedure: REMOVAL HARDWARE, FUSION  LISFRANC JOINT, CHEVRON AND AIKEN OSTEOTOMY, WEIL OSTEOTOMY 2ND METATARSAL LEFT FOOT;  Surgeon: Nadara Mustard, MD;  Location: [facility] OR;  Service: Orthopedics;  Laterality: Left;   Social History   Occupational History   Not on file  Tobacco Use   Smoking status: Every Day    Current packs/day: 1.00    Types: Cigarettes   Smokeless tobacco: Never  Vaping Use   Vaping status: Never Used  Substance and Sexual Activity   Alcohol use: Not Currently   Drug use: Not Currently   Sexual activity: Yes

## 2023-09-11 ENCOUNTER — Ambulatory Visit: Payer: Self-pay | Admitting: Orthopedic Surgery

## 2023-09-17 ENCOUNTER — Other Ambulatory Visit (INDEPENDENT_AMBULATORY_CARE_PROVIDER_SITE_OTHER): Payer: Self-pay

## 2023-09-17 ENCOUNTER — Encounter: Payer: Self-pay | Admitting: Orthopedic Surgery

## 2023-09-17 ENCOUNTER — Ambulatory Visit (INDEPENDENT_AMBULATORY_CARE_PROVIDER_SITE_OTHER): Payer: Self-pay | Admitting: Orthopedic Surgery

## 2023-09-17 DIAGNOSIS — S93325A Dislocation of tarsometatarsal joint of left foot, initial encounter: Secondary | ICD-10-CM

## 2023-09-17 DIAGNOSIS — M79672 Pain in left foot: Secondary | ICD-10-CM

## 2023-09-17 DIAGNOSIS — M205X2 Other deformities of toe(s) (acquired), left foot: Secondary | ICD-10-CM

## 2023-09-17 DIAGNOSIS — M21612 Bunion of left foot: Secondary | ICD-10-CM

## 2023-09-17 NOTE — Progress Notes (Signed)
 Office Visit Note   Patient: Steven Herring           Date of Birth: 07/22/1980           MRN: [id number] Visit Date: 09/17/2023              Requested by: No referring provider defined for this encounter. PCP: Pcp, No  Chief Complaint  Patient presents with   Left Foot - Follow-up    09/2022 1 year s/p internal fixation for Lisfranc fracture dislocation as well as a chevron osteotomy of the first metatarsal       HPI: Patient is a 43 year old gentleman who is over 2 years out from a left foot crush injury from work.  Patient underwent initial midfoot fusion on the left on May 20, 2021.  Patient underwent bunion surgery of the great toe and claw toes surgery for the second toe September 08, 2022.  At this time patient underwent removal of deep retained hardware for revision fusion across the Lisfranc complex.  Patient states that he still ambulates with a cane.  He uses extra-depth shoes.  He states that he has not had a new pair of extra-depth shoes from [employer].  Patient's original functional capacity evaluation was performed December 23, 2021.  Patient was rated at a light physical demand category with the ability to walk.  Patient states she is having increasing clawing of all of his toes.  Assessment & Plan: Visit Diagnoses:  1. Pain in left foot   2. Lisfranc dislocation, left, initial encounter   3. Bunion of great toe of left foot   4. Claw toe, acquired, left     Plan: With patient's persistent global symptoms I have written patient a note stating that he is permanently disabled from working on his feet.  Patient's fusion is stable however patient has symptoms in the tendons and soft tissue of his foot with essentially no subtalar motion.  In review of the [place]  industrial guidelines for permanent partial impairment his permanent partial impairment would be 25% of the left foot with loss of subtalar motion and good ankle motion.  Follow-Up Instructions: No  follow-ups on file.   Ortho Exam  Patient is alert, oriented, no adenopathy, well-dressed, normal affect, normal respiratory effort. Examination patient has tenderness to palpation over the posterior tibial tendon and the peroneal tendon.  There is tenderness to palpation along the plantar fascia.  Distraction across the fusion site is not painful.  Patient has essentially no subtalar motion with good range of motion of the ankle.  With patient standing his foot is flat.  Patient has progressive fixed clawing of all the lesser toes secondary to the loss of intrinsic function secondary to his crush injury.  Radiographs show stable fusion across the midfoot.  Patient has dorsiflexion of the ankle to neutral.  Patient states that he has pain with shower water hitting his foot.  Patient is currently on Neurontin  and he states he is trying to establish a pain management clinic for follow-up.  Currently provided Neurontin  by his primary care physician.  Examination of the left foot patient has normal turgor normal skin color and normal temperature.  Patient does have discomfort with light touch but no dystrophic changes in the skin or bone.  No clinical signs of complex regional pain syndrome.  Patient states his pain is currently relieved with Neurontin .    Imaging: No results found. No images are attached to the encounter.  Labs: No results  found for: HGBA1C, ESRSEDRATE, CRP, LABURIC, REPTSTATUS, GRAMSTAIN, CULT, LABORGA   No results found for: ALBUMIN, PREALBUMIN, CBC  No results found for: MG No results found for: VD25OH  No results found for: PREALBUMIN    Latest Ref Rng & Units 09/08/2022    6:25 AM 12/22/2021    6:20 PM 05/20/2021    5:49 AM  CBC EXTENDED  WBC 4.0 - 10.5 K/uL 4.8  5.2  5.5   RBC 4.22 - 5.81 MIL/uL 4.89  5.27  5.48   Hemoglobin 13.0 - 17.0 g/dL 81.1  91.4  78.2   HCT 39.0 - 52.0 % 42.2  45.5  47.9   Platelets 150 - 400 K/uL 270  232  263       There is no height or weight on file to calculate BMI.  Orders:  Orders Placed This Encounter  Procedures   XR Foot Complete Left   No orders of the defined types were placed in this encounter.    Procedures: No procedures performed  Clinical Data: No additional findings.  ROS:  All other systems negative, except as noted in the HPI. Review of Systems  Objective: Vital Signs: There were no vitals taken for this visit.  Specialty Comments:  No specialty comments available.  PMFS History: Patient Active Problem List   Diagnosis Date Noted   Bunion of great toe of left foot 09/08/2022   Acquired claw toe, left 09/08/2022   Lisfranc dislocation, left, initial encounter    Contusion of bone 12/29/2020   History reviewed. No pertinent past medical history.  History reviewed. No pertinent family history.  Past Surgical History:  Procedure Laterality Date   FOOT ARTHRODESIS Left 05/20/2021   Procedure: LEFT MIDFOOT FUSION;  Surgeon: Timothy Ford, MD;  Location: [facility] OR;  Service: Orthopedics;  Laterality: Left;   FOOT ARTHRODESIS Left 09/08/2022   Procedure: REMOVAL HARDWARE, FUSION LISFRANC JOINT, CHEVRON AND AIKEN OSTEOTOMY, WEIL OSTEOTOMY 2ND METATARSAL LEFT FOOT;  Surgeon: Timothy Ford, MD;  Location: [facility] OR;  Service: Orthopedics;  Laterality: Left;   Social History   Occupational History   Not on file  Tobacco Use   Smoking status: Every Day    Current packs/day: 1.00    Types: Cigarettes   Smokeless tobacco: Never  Vaping Use   Vaping status: Never Used  Substance and Sexual Activity   Alcohol use: Not Currently   Drug use: Not Currently   Sexual activity: Yes

## 2024-02-04 ENCOUNTER — Encounter: Payer: Self-pay | Admitting: Radiology
